# Patient Record
Sex: Male | Born: 1960 | Race: White | Hispanic: No | Marital: Single | Smoking: Current every day smoker
Health system: Southern US, Community
[De-identification: ages and names within clinical notes are randomized; demographics above are authoritative.]

## PROBLEM LIST (undated history)

## (undated) DIAGNOSIS — J189 Pneumonia, unspecified organism: Secondary | ICD-10-CM

## (undated) DIAGNOSIS — E119 Type 2 diabetes mellitus without complications: Secondary | ICD-10-CM

## (undated) HISTORY — DX: Type 2 diabetes mellitus without complications: E11.9

## (undated) HISTORY — PX: BACK SURGERY: SHX140

---

## 2020-09-20 ENCOUNTER — Other Ambulatory Visit: Payer: Self-pay

## 2020-09-20 DIAGNOSIS — Z20822 Contact with and (suspected) exposure to covid-19: Secondary | ICD-10-CM

## 2020-09-21 LAB — SARS-COV-2, NAA 2 DAY TAT

## 2020-09-21 LAB — NOVEL CORONAVIRUS, NAA: SARS-CoV-2, NAA: NOT DETECTED

## 2021-09-11 ENCOUNTER — Emergency Department (HOSPITAL_COMMUNITY)
Admission: EM | Admit: 2021-09-11 | Discharge: 2021-09-11 | Disposition: A | Discharge status: Home / Self Care | Payer: 59 | Source: Home / Self Care | Attending: Emergency Medicine | Admitting: Emergency Medicine

## 2021-09-11 ENCOUNTER — Emergency Department (HOSPITAL_COMMUNITY): Payer: 59

## 2021-09-11 ENCOUNTER — Other Ambulatory Visit: Payer: Self-pay

## 2021-09-11 ENCOUNTER — Encounter (HOSPITAL_COMMUNITY): Payer: Self-pay | Admitting: Emergency Medicine

## 2021-09-11 DIAGNOSIS — E1165 Type 2 diabetes mellitus with hyperglycemia: Secondary | ICD-10-CM | POA: Insufficient documentation

## 2021-09-11 DIAGNOSIS — A419 Sepsis, unspecified organism: Secondary | ICD-10-CM | POA: Diagnosis not present

## 2021-09-11 DIAGNOSIS — J189 Pneumonia, unspecified organism: Secondary | ICD-10-CM

## 2021-09-11 DIAGNOSIS — R112 Nausea with vomiting, unspecified: Secondary | ICD-10-CM | POA: Insufficient documentation

## 2021-09-11 DIAGNOSIS — R197 Diarrhea, unspecified: Secondary | ICD-10-CM | POA: Insufficient documentation

## 2021-09-11 DIAGNOSIS — R000 Tachycardia, unspecified: Secondary | ICD-10-CM | POA: Insufficient documentation

## 2021-09-11 DIAGNOSIS — R918 Other nonspecific abnormal finding of lung field: Secondary | ICD-10-CM

## 2021-09-11 DIAGNOSIS — Z20822 Contact with and (suspected) exposure to covid-19: Secondary | ICD-10-CM | POA: Insufficient documentation

## 2021-09-11 DIAGNOSIS — R739 Hyperglycemia, unspecified: Secondary | ICD-10-CM

## 2021-09-11 DIAGNOSIS — D696 Thrombocytopenia, unspecified: Secondary | ICD-10-CM | POA: Insufficient documentation

## 2021-09-11 HISTORY — DX: Type 2 diabetes mellitus without complications: E11.9

## 2021-09-11 LAB — COMPREHENSIVE METABOLIC PANEL
ALT: 37 U/L (ref 0–44)
AST: 38 U/L (ref 15–41)
Albumin: 2.5 g/dL — ABNORMAL LOW (ref 3.5–5.0)
Alkaline Phosphatase: 83 U/L (ref 38–126)
Anion gap: 14 (ref 5–15)
BUN: 35 mg/dL — ABNORMAL HIGH (ref 8–23)
CO2: 20 mmol/L — ABNORMAL LOW (ref 22–32)
Calcium: 7.8 mg/dL — ABNORMAL LOW (ref 8.9–10.3)
Chloride: 90 mmol/L — ABNORMAL LOW (ref 98–111)
Creatinine, Ser: 1.15 mg/dL (ref 0.61–1.24)
GFR, Estimated: >60 mL/min (ref >60)
Glucose, Bld: 590 mg/dL (ref 70–99)
Potassium: 4.2 mmol/L (ref 3.5–5.1)
Sodium: 124 mmol/L — ABNORMAL LOW (ref 135–145)
Total Bilirubin: 1 mg/dL (ref 0.3–1.2)
Total Protein: 6.5 g/dL (ref 6.5–8.1)

## 2021-09-11 LAB — CBC WITH DIFFERENTIAL/PLATELET
Abs Immature Granulocytes: 0.09 10*3/uL — ABNORMAL HIGH (ref 0.00–0.07)
Basophils Absolute: 0 10*3/uL (ref 0.0–0.1)
Basophils Relative: 0 %
Eosinophils Absolute: 0 10*3/uL (ref 0.0–0.5)
Eosinophils Relative: 0 %
HCT: 39.5 % (ref 39.0–52.0)
Hemoglobin: 13.9 g/dL (ref 13.0–17.0)
Immature Granulocytes: 1 %
Lymphocytes Relative: 5 %
Lymphs Abs: 0.4 10*3/uL — ABNORMAL LOW (ref 0.7–4.0)
MCH: 35.2 pg — ABNORMAL HIGH (ref 26.0–34.0)
MCHC: 35.2 g/dL (ref 30.0–36.0)
MCV: 100 fL (ref 80.0–100.0)
Monocytes Absolute: 0.7 10*3/uL (ref 0.1–1.0)
Monocytes Relative: 8 %
Neutro Abs: 6.8 10*3/uL (ref 1.7–7.7)
Neutrophils Relative %: 86 %
Platelets: 137 10*3/uL — ABNORMAL LOW (ref 150–400)
RBC: 3.95 MIL/uL — ABNORMAL LOW (ref 4.22–5.81)
RDW: 11.9 % (ref 11.5–15.5)
WBC: 8 10*3/uL (ref 4.0–10.5)
nRBC: 0 % (ref 0.0–0.2)

## 2021-09-11 LAB — BLOOD GAS, VENOUS
Acid-base deficit: 4.9 mmol/L — ABNORMAL HIGH (ref 0.0–2.0)
Bicarbonate: 20.8 mmol/L (ref 20.0–28.0)
FIO2: 21
O2 Saturation: 97.6 %
Patient temperature: 36.7
pCO2, Ven: 31.2 mmHg — ABNORMAL LOW (ref 44.0–60.0)
pH, Ven: 7.401 (ref 7.250–7.430)
pO2, Ven: 116 mmHg — ABNORMAL HIGH (ref 32.0–45.0)

## 2021-09-11 LAB — BASIC METABOLIC PANEL
Anion gap: 14 (ref 5–15)
BUN: 33 mg/dL — ABNORMAL HIGH (ref 8–23)
CO2: 19 mmol/L — ABNORMAL LOW (ref 22–32)
Calcium: 7.7 mg/dL — ABNORMAL LOW (ref 8.9–10.3)
Chloride: 96 mmol/L — ABNORMAL LOW (ref 98–111)
Creatinine, Ser: 1.08 mg/dL (ref 0.61–1.24)
GFR, Estimated: >60 mL/min (ref >60)
Glucose, Bld: 474 mg/dL — ABNORMAL HIGH (ref 70–99)
Potassium: 4.2 mmol/L (ref 3.5–5.1)
Sodium: 129 mmol/L — ABNORMAL LOW (ref 135–145)

## 2021-09-11 LAB — RESP PANEL BY RT-PCR (FLU A&B, COVID) ARPGX2
Influenza A by PCR: NEGATIVE
Influenza B by PCR: NEGATIVE
SARS Coronavirus 2 by RT PCR: NEGATIVE

## 2021-09-11 LAB — APTT: aPTT: 31 seconds (ref 24–36)

## 2021-09-11 LAB — CBG MONITORING, ED
Glucose-Capillary: 520 mg/dL (ref 70–99)
Glucose-Capillary: 453 mg/dL — ABNORMAL HIGH (ref 70–99)

## 2021-09-11 LAB — PROTIME-INR
INR: 1.1 (ref 0.8–1.2)
Prothrombin Time: 14.6 seconds (ref 11.4–15.2)

## 2021-09-11 LAB — MAGNESIUM: Magnesium: 2.3 mg/dL (ref 1.7–2.4)

## 2021-09-11 LAB — LACTIC ACID, PLASMA
Lactic Acid, Venous: 1.7 mmol/L (ref 0.5–1.9)
Lactic Acid, Venous: 1.7 mmol/L (ref 0.5–1.9)

## 2021-09-11 MED ORDER — DEXTROSE 50 % IV SOLN: 0.0000 mL | INTRAVENOUS | Status: DC | PRN

## 2021-09-11 MED ORDER — DEXTROSE IN LACTATED RINGERS 5 % IV SOLN
INTRAVENOUS | Status: DC
Start: 2021-09-11 — End: 2021-09-11

## 2021-09-11 MED ORDER — SODIUM CHLORIDE 0.9 % IV BOLUS (SEPSIS)
1500.0000 mL | Freq: Once | INTRAVENOUS | Status: AC
Start: 2021-09-11 — End: 2021-09-11
  Administered 2021-09-11: 1500 mL via INTRAVENOUS

## 2021-09-11 MED ORDER — AZITHROMYCIN 250 MG PO TABS
500.0000 mg | ORAL_TABLET | Freq: Once | ORAL | Status: AC
  Administered 2021-09-11: 500 mg via ORAL
  Filled 2021-09-11: qty 2

## 2021-09-11 MED ORDER — ONDANSETRON HCL 4 MG PO TABS: 4.0000 mg | ORAL_TABLET | Freq: Three times a day (TID) | ORAL | 0 refills | Status: DC | PRN

## 2021-09-11 MED ORDER — AZITHROMYCIN 250 MG PO TABS: 250.0000 mg | ORAL_TABLET | Freq: Every day | ORAL | 0 refills | Status: DC

## 2021-09-11 MED ORDER — INSULIN REGULAR(HUMAN) IN NACL 100-0.9 UT/100ML-% IV SOLN
INTRAVENOUS | Status: DC
Start: 2021-09-11 — End: 2021-09-11

## 2021-09-11 MED ORDER — SODIUM CHLORIDE 0.9 % IV BOLUS (SEPSIS)
2500.0000 mL | Freq: Once | INTRAVENOUS | Status: DC
Start: 2021-09-11 — End: 2021-09-11

## 2021-09-11 MED ORDER — SODIUM CHLORIDE 0.9 % IV SOLN
1.0000 g | Freq: Once | INTRAVENOUS | Status: DC
  Filled 2021-09-11: qty 10

## 2021-09-11 MED ORDER — LACTATED RINGERS IV SOLN: INTRAVENOUS | Status: DC

## 2021-09-11 NOTE — ED Triage Notes (Signed)
Pt to the ED [facility] who called a code sepsis in route.  Pt went to [facility] last Wednesday with Covid symptoms but was not tested.  Pt was given one gram of Rocephin in the left deltoid in route as well as a 500 bolus.

## 2021-09-11 NOTE — ED Provider Notes (Signed)
[facility] EMERGENCY DEPARTMENT Provider Note   CSN: [id number] Arrival date & time: 09/11/21  1613     History Chief Complaint  Patient presents with   Code Sepsis    Howard Cameron is a 60 y.o. male who presents the emergency department via EMS for weakness.  He has a past medical history of daily smoking and diabetes.  Patient does not take any medications for his diabetes.  He began having symptoms of nausea vomiting and diarrhea starting on Tuesday of last week.  He has had a rapidly progressive decline in states that his daughter and wife made him come by EMS today.  He has been too weak to smoke and too weak to walk to the bathroom today.  He states that he feels pretty terrible.  He denies any abdominal pain.  He denies fevers or chills.  He was seen at an urgent care earlier this week due to COVID symptoms but apparently did not get COVID tested.  He denies any other known medical issues.  EMS called a code sepsis he was given a liter of fluid and a gram of Rocephin prior to arrival  HPI     Past Medical History:  Diagnosis Date   Diabetes mellitus without complication (HCC)     There are no problems to display for this patient.       No family history on file.     Home Medications Prior to Admission medications   Not on File    Allergies    Patient has no known allergies.  Review of Systems   Review of Systems Ten systems reviewed and are negative for acute change, except as noted in the HPI.   Physical Exam Updated Vital Signs BP 96/63   Pulse (!) 105   Temp 98.1 F (36.7 C) (Oral)   Resp (!) 22   Ht 5\' 10"  (1.778 m)   Wt 79.4 kg   SpO2 93%   BMI 25.11 kg/m   Physical Exam Vitals and nursing note reviewed.  Constitutional:      General: He is not in acute distress.    Appearance: He is well-developed. He is ill-appearing. He is not toxic-appearing or diaphoretic.  HENT:     Head: Normocephalic and atraumatic.  Eyes:     General: No  scleral icterus.    Conjunctiva/sclera: Conjunctivae normal.  Cardiovascular:     Rate and Rhythm: Regular rhythm. Tachycardia present.     Pulses:          Dorsalis pedis pulses are 2+ on the right side and 2+ on the left side.       Posterior tibial pulses are 2+ on the right side and 2+ on the left side.     Heart sounds: Normal heart sounds.  Pulmonary:     Effort: Pulmonary effort is normal. Tachypnea present. No respiratory distress.     Breath sounds: Normal breath sounds.  Abdominal:     Palpations: Abdomen is soft.     Tenderness: There is no abdominal tenderness.  Musculoskeletal:     Cervical back: Normal range of motion and neck supple.  Skin:    General: Skin is warm and dry.  Neurological:     Mental Status: He is alert.  Psychiatric:        Behavior: Behavior normal.    ED Results / Procedures / Treatments   Labs (all labs ordered are listed, but only abnormal results are displayed) Labs Reviewed  RESP  PANEL BY RT-PCR (FLU A&B, COVID) [id number]  CULTURE, BLOOD (ROUTINE X 2)  CULTURE, BLOOD (ROUTINE X 2)  LACTIC ACID, PLASMA  LACTIC ACID, PLASMA  COMPREHENSIVE METABOLIC PANEL  CBC WITH DIFFERENTIAL/PLATELET  PROTIME-INR  APTT  URINALYSIS, ROUTINE W REFLEX MICROSCOPIC  BLOOD GAS, VENOUS    EKG None  Radiology No results found.  Procedures Procedures   Medications Ordered in ED Medications  sodium chloride 0.9 % bolus 2,500 mL (has no administration in time range)    ED Course  I have reviewed the triage vital signs and the nursing notes.  Pertinent labs & imaging results that were available during my care of the patient were reviewed by me and considered in my medical decision making (see chart for details).    MDM Rules/Calculators/A&P                         60 year old male here with complaint of generalized weakness.The differential diagnosis of weakness includes but is not limited to neurologic causes (GBS, myasthenia gravis, CVA, MS, ALS,  transverse myelitis, spinal cord injury, CVA, botulism, ) and other causes: ACS, Arrhythmia, syncope, orthostatic hypotension, sepsis, hypoglycemia, electrolyte disturbance, hypothyroidism, respiratory failure, symptomatic anemia, dehydration, heat injury, polypharmacy, malignancy, DKA DKADKA. The patient is a noncompliant diabetic.  Code sepsis was initiated by EMS.  Upon arrival patient was tachypneic, hypotensive and tachycardic.  Sepsis work-up initiated. I ordered and reviewed labs that included CBC without elevated white blood cell count, mild thrombocytopenia.  CMP with blood glucose of 590, hypocalcemia of 7.8 which corrected for hypoalbuminemia is 8.2, VBG shows slightly low bicarb level suggestive of acidosis.  Lactate level within normal limits, mag level added for hypercalcemia within normal limits.  COVID panel is negative, PT/INR and APTT within normal limits.  I ordered and reviewed a 1 view chest x-ray which shows right upper and middle lobe consolidations consistent with Multi lobar pneumonia.  Upon reviewing all the data points I returned to speak with the patient about admission.  Patient is adamant that he does not want to be admitted.  I had a long discussion with the patient about risks that include severe morbidity or mortality given his pneumonia and hyperglycemia which likely needs to be managed with insulin.  Patient understands his risks but declines admission despite extended conversation about all of the patient's current illnesses and need to be hospitalized. Patient is discharged in stable but serious condition.  I ordered and reviewed Date: 09/11/2021 Patient: Howard Cameron Admitted: 09/11/2021  4:16 PM Attending Provider: Linwood Dibbles, MD  Howard Cameron or his authorized caregiver has made the decision for the patient to leave the emergency department against the advice of [facility]   He or his authorized caregiver has been informed and understands the  inherent risks, including death.  He or his authorized caregiver has decided to accept the responsibility for this decision. Howard Cameron and all necessary parties have been advised that he may return for further evaluation or treatment. His condition at time of discharge was Serious.  Howard Cameron had current vital signs as follows:  Blood pressure 134/83, pulse 98, temperature 98.1 F (36.7 C), temperature source Oral, resp. rate 20, height 5\' 10"  (1.778 m), weight 79.4 kg, SpO2 96 %.   or his authorized caregiver has signed the Leaving Against Medical Advice form prior to leaving the department.  Howard Cameron 09/11/2021    Final Clinical Impression(s) /  ED Diagnoses Final diagnoses:  Multilobar lung infiltrate  Community acquired pneumonia of right lung, unspecified part of lung  Hyperglycemia    Rx / DC Orders ED Discharge Orders     None        Arthor Captain, PA-C 09/11/21 1956    Linwood Dibbles, MD 09/13/21 [phone]

## 2021-09-11 NOTE — Discharge Instructions (Addendum)
You have multilobar right-sided pneumonia.  You are leaving AGAINST MEDICAL ADVICE but will receive antibiotics.  Please understand that you are welcome to return to the emergency department at any point with the recommendation for admission.  Currently your blood sugar is excessively high and would need insulin management.  I am unable to provide any management for your blood sugar in the outpatient setting and you must follow-up with a primary care physician as soon as possible.  Understand that you are leaving the hospital in serious condition with the possibility of severe morbid outcomes and or death. Uncontrolled high blood sugar has many effects including accelerated plaquing of the arteries which could lead to stroke, heart attack, poor circulation to the legs and arms, loss of limbs, blindness, retinal detachment, kidney failure, chronic pain due to neuropathy and overall very poor quality of life.  Get help right away if: Your shortness of breath becomes worse. Your chest pain increases. Your sickness becomes worse, especially if you are an older adult or have a weak immune system. You cough up blood.

## 2021-09-13 ENCOUNTER — Encounter (HOSPITAL_COMMUNITY): Payer: Self-pay

## 2021-09-13 ENCOUNTER — Inpatient Hospital Stay (HOSPITAL_COMMUNITY)
Admission: EM | Admit: 2021-09-13 | Discharge: 2021-09-16 | DRG: 871 | Disposition: A | Discharge status: Home / Self Care | Payer: 59 | Attending: Family Medicine | Admitting: Family Medicine

## 2021-09-13 ENCOUNTER — Other Ambulatory Visit: Payer: Self-pay

## 2021-09-13 ENCOUNTER — Emergency Department (HOSPITAL_COMMUNITY): Payer: 59

## 2021-09-13 DIAGNOSIS — Z20822 Contact with and (suspected) exposure to covid-19: Secondary | ICD-10-CM | POA: Diagnosis present

## 2021-09-13 DIAGNOSIS — E1149 Type 2 diabetes mellitus with other diabetic neurological complication: Secondary | ICD-10-CM | POA: Diagnosis present

## 2021-09-13 DIAGNOSIS — J189 Pneumonia, unspecified organism: Secondary | ICD-10-CM | POA: Diagnosis present

## 2021-09-13 DIAGNOSIS — Z23 Encounter for immunization: Secondary | ICD-10-CM

## 2021-09-13 DIAGNOSIS — Z6825 Body mass index (BMI) 25.0-25.9, adult: Secondary | ICD-10-CM | POA: Diagnosis not present

## 2021-09-13 DIAGNOSIS — K7200 Acute and subacute hepatic failure without coma: Secondary | ICD-10-CM | POA: Diagnosis not present

## 2021-09-13 DIAGNOSIS — E440 Moderate protein-calorie malnutrition: Secondary | ICD-10-CM | POA: Diagnosis present

## 2021-09-13 DIAGNOSIS — E1165 Type 2 diabetes mellitus with hyperglycemia: Secondary | ICD-10-CM | POA: Diagnosis present

## 2021-09-13 DIAGNOSIS — E559 Vitamin D deficiency, unspecified: Secondary | ICD-10-CM | POA: Diagnosis present

## 2021-09-13 DIAGNOSIS — Z79899 Other long term (current) drug therapy: Secondary | ICD-10-CM | POA: Diagnosis not present

## 2021-09-13 DIAGNOSIS — A419 Sepsis, unspecified organism: Principal | ICD-10-CM | POA: Diagnosis present

## 2021-09-13 DIAGNOSIS — E119 Type 2 diabetes mellitus without complications: Secondary | ICD-10-CM | POA: Diagnosis not present

## 2021-09-13 DIAGNOSIS — F17210 Nicotine dependence, cigarettes, uncomplicated: Secondary | ICD-10-CM | POA: Diagnosis present

## 2021-09-13 DIAGNOSIS — R197 Diarrhea, unspecified: Secondary | ICD-10-CM | POA: Diagnosis present

## 2021-09-13 DIAGNOSIS — E46 Unspecified protein-calorie malnutrition: Secondary | ICD-10-CM | POA: Diagnosis present

## 2021-09-13 DIAGNOSIS — R6520 Severe sepsis without septic shock: Secondary | ICD-10-CM | POA: Diagnosis not present

## 2021-09-13 HISTORY — DX: Pneumonia, unspecified organism: J18.9

## 2021-09-13 LAB — CBC WITH DIFFERENTIAL/PLATELET
Abs Immature Granulocytes: 0.12 10*3/uL — ABNORMAL HIGH (ref 0.00–0.07)
Basophils Absolute: 0.1 10*3/uL (ref 0.0–0.1)
Basophils Relative: 1 %
Eosinophils Absolute: 0.1 10*3/uL (ref 0.0–0.5)
Eosinophils Relative: 2 %
HCT: 41.8 % (ref 39.0–52.0)
Hemoglobin: 14 g/dL (ref 13.0–17.0)
Immature Granulocytes: 2 %
Lymphocytes Relative: 11 %
Lymphs Abs: 0.9 10*3/uL (ref 0.7–4.0)
MCH: 34.6 pg — ABNORMAL HIGH (ref 26.0–34.0)
MCHC: 33.5 g/dL (ref 30.0–36.0)
MCV: 103.2 fL — ABNORMAL HIGH (ref 80.0–100.0)
Monocytes Absolute: 0.7 10*3/uL (ref 0.1–1.0)
Monocytes Relative: 8 %
Neutro Abs: 6.4 10*3/uL (ref 1.7–7.7)
Neutrophils Relative %: 76 %
Platelets: 205 10*3/uL (ref 150–400)
RBC: 4.05 MIL/uL — ABNORMAL LOW (ref 4.22–5.81)
RDW: 12.2 % (ref 11.5–15.5)
WBC: 8.3 10*3/uL (ref 4.0–10.5)
nRBC: 0 % (ref 0.0–0.2)

## 2021-09-13 LAB — COMPREHENSIVE METABOLIC PANEL
ALT: 72 U/L — ABNORMAL HIGH (ref 0–44)
AST: 112 U/L — ABNORMAL HIGH (ref 15–41)
Albumin: 2.7 g/dL — ABNORMAL LOW (ref 3.5–5.0)
Alkaline Phosphatase: 93 U/L (ref 38–126)
Anion gap: 6 (ref 5–15)
BUN: 24 mg/dL — ABNORMAL HIGH (ref 8–23)
CO2: 28 mmol/L (ref 22–32)
Calcium: 7.9 mg/dL — ABNORMAL LOW (ref 8.9–10.3)
Chloride: 92 mmol/L — ABNORMAL LOW (ref 98–111)
Creatinine, Ser: 0.93 mg/dL (ref 0.61–1.24)
GFR, Estimated: >60 mL/min (ref >60)
Glucose, Bld: 305 mg/dL — ABNORMAL HIGH (ref 70–99)
Potassium: 3.7 mmol/L (ref 3.5–5.1)
Sodium: 126 mmol/L — ABNORMAL LOW (ref 135–145)
Total Bilirubin: 1 mg/dL (ref 0.3–1.2)
Total Protein: 7 g/dL (ref 6.5–8.1)

## 2021-09-13 LAB — URINALYSIS, ROUTINE W REFLEX MICROSCOPIC
Bilirubin Urine: NEGATIVE
Glucose, UA: >=500 mg/dL — AB
Ketones, ur: 20 mg/dL — AB
Leukocytes,Ua: NEGATIVE
Nitrite: NEGATIVE
Protein, ur: 30 mg/dL — AB
Specific Gravity, Urine: 1.023 (ref 1.005–1.030)
pH: 6 (ref 5.0–8.0)

## 2021-09-13 LAB — CBG MONITORING, ED: Glucose-Capillary: 319 mg/dL — ABNORMAL HIGH (ref 70–99)

## 2021-09-13 LAB — APTT: aPTT: 29 seconds (ref 24–36)

## 2021-09-13 LAB — C DIFFICILE QUICK SCREEN W PCR REFLEX
C Diff antigen: POSITIVE — AB
C Diff toxin: NEGATIVE

## 2021-09-13 LAB — LACTIC ACID, PLASMA
Lactic Acid, Venous: 2.2 mmol/L (ref 0.5–1.9)
Lactic Acid, Venous: 1.8 mmol/L (ref 0.5–1.9)

## 2021-09-13 LAB — PROTIME-INR
INR: 1 (ref 0.8–1.2)
Prothrombin Time: 13.5 seconds (ref 11.4–15.2)

## 2021-09-13 LAB — BETA-HYDROXYBUTYRIC ACID: Beta-Hydroxybutyric Acid: 1.94 mmol/L — ABNORMAL HIGH (ref 0.05–0.27)

## 2021-09-13 MED ORDER — SODIUM CHLORIDE 0.9 % IV SOLN
2.0000 g | INTRAVENOUS | Status: DC
  Administered 2021-09-13: 2 g via INTRAVENOUS
  Filled 2021-09-13: qty 20

## 2021-09-13 MED ORDER — LACTATED RINGERS IV SOLN: INTRAVENOUS | Status: DC

## 2021-09-13 MED ORDER — SODIUM CHLORIDE 0.9 % IV SOLN
500.0000 mg | INTRAVENOUS | Status: DC
  Administered 2021-09-13: 500 mg via INTRAVENOUS
  Filled 2021-09-13: qty 500

## 2021-09-13 MED ORDER — DIPHENHYDRAMINE HCL 50 MG/ML IJ SOLN
25.0000 mg | Freq: Once | INTRAMUSCULAR | Status: AC
  Administered 2021-09-14: 25 mg via INTRAVENOUS
  Filled 2021-09-13 (×2): qty 1

## 2021-09-13 MED ORDER — LACTATED RINGERS IV BOLUS
1000.0000 mL | Freq: Once | INTRAVENOUS | Status: AC
  Administered 2021-09-13: 1000 mL via INTRAVENOUS

## 2021-09-13 MED ORDER — INSULIN ASPART 100 UNIT/ML IJ SOLN
0.0000 [IU] | Freq: Three times a day (TID) | INTRAMUSCULAR | Status: DC
  Administered 2021-09-14 (×2): 5 [IU] via SUBCUTANEOUS
  Administered 2021-09-14 – 2021-09-15 (×2): 8 [IU] via SUBCUTANEOUS
  Administered 2021-09-15 (×2): 3 [IU] via SUBCUTANEOUS
  Administered 2021-09-16: 5 [IU] via SUBCUTANEOUS

## 2021-09-13 MED ORDER — INSULIN ASPART 100 UNIT/ML IJ SOLN
0.0000 [IU] | Freq: Every day | INTRAMUSCULAR | Status: DC
  Administered 2021-09-14: 2 [IU] via SUBCUTANEOUS

## 2021-09-13 MED ORDER — MELATONIN 3 MG PO TABS
6.0000 mg | ORAL_TABLET | Freq: Every day | ORAL | Status: DC
  Administered 2021-09-14 – 2021-09-15 (×3): 6 mg via ORAL
  Filled 2021-09-13 (×4): qty 2

## 2021-09-13 NOTE — ED Provider Notes (Signed)
[facility] EMERGENCY DEPARTMENT Provider Note   CSN: [id number] Arrival date & time: 09/13/21  1946     History Chief Complaint  Patient presents with   Pneumonia    Howard Cameron is a 60 y.o. male with PMH diabetes who presents to the emergency department for evaluation of cough, fever, diarrhea, chills and hyperglycemia.  Patient was previously seen on 9/27 here at [facility] and diagnosed with significant hyperglycemia in the setting of a multilobar right-sided pneumonia and the patient ultimately left AMA due to having to take care of his children at home.  Patient returns now stating that he feels more of a "mental fog" and all of his symptoms have been persistent.  Also endorses copious watery nonbloody diarrhea.  Denies abdominal pain, headache, persistent vomiting or other systemic symptoms.   Pneumonia Pertinent negatives include no chest pain, no abdominal pain and no shortness of breath.      Past Medical History:  Diagnosis Date   Diabetes mellitus without complication (HCC)    Pneumonia     There are no problems to display for this patient.   History reviewed. No pertinent surgical history.     No family history on file.  Social History   Tobacco Use   Smoking status: Every Day    Packs/day: 0.50    Types: Cigarettes   Smokeless tobacco: Never  Substance Use Topics   Alcohol use: Yes    Comment: couple of drinks a night   Drug use: Not Currently    Home Medications Prior to Admission medications   Medication Sig Start Date End Date Taking? Authorizing Provider  azithromycin (ZITHROMAX Z-PAK) 250 MG tablet Take 1 tablet (250 mg total) by mouth daily for 4 days. 2 po day one, then 1 daily x 4 days 09/11/21 09/15/21  Arthor Captain, PA-C  ondansetron (ZOFRAN) 4 MG tablet Take 1 tablet (4 mg total) by mouth every 8 (eight) hours as needed for nausea or vomiting. 09/11/21   Arthor Captain, PA-C    Allergies    Patient has no known  allergies.  Review of Systems   Review of Systems  Constitutional:  Positive for chills and fever.  HENT:  Negative for ear pain and sore throat.   Eyes:  Negative for pain and visual disturbance.  Respiratory:  Positive for cough. Negative for shortness of breath.   Cardiovascular:  Negative for chest pain and palpitations.  Gastrointestinal:  Positive for diarrhea. Negative for abdominal pain and vomiting.  Genitourinary:  Negative for dysuria and hematuria.  Musculoskeletal:  Negative for arthralgias and back pain.  Skin:  Negative for color change and rash.  Neurological:  Negative for seizures and syncope.  All other systems reviewed and are negative.  Physical Exam Updated Vital Signs BP (!) 144/93   Pulse (!) 113   Temp 98.5 F (36.9 C)   Resp (!) 26   Ht 5\' 10"  (1.778 m)   Wt 79.4 kg   SpO2 93%   BMI 25.11 kg/m   Physical Exam Vitals and nursing note reviewed.  Constitutional:      Appearance: He is well-developed. He is ill-appearing.  HENT:     Head: Normocephalic and atraumatic.  Eyes:     Conjunctiva/sclera: Conjunctivae normal.  Cardiovascular:     Rate and Rhythm: Regular rhythm. Tachycardia present.     Heart sounds: No murmur heard. Pulmonary:     Effort: Pulmonary effort is normal. No respiratory distress.     Breath  sounds: Rhonchi (RLL) present.  Abdominal:     Palpations: Abdomen is soft.     Tenderness: There is no abdominal tenderness.  Musculoskeletal:     Cervical back: Neck supple.  Skin:    General: Skin is warm and dry.  Neurological:     Mental Status: He is alert.    ED Results / Procedures / Treatments   Labs (all labs ordered are listed, but only abnormal results are displayed) Labs Reviewed  LACTIC ACID, PLASMA - Abnormal; Notable for the following components:      Result Value   Lactic Acid, Venous 2.2 (*)    All other components within normal limits  COMPREHENSIVE METABOLIC PANEL - Abnormal; Notable for the following  components:   Sodium 126 (*)    Chloride 92 (*)    Glucose, Bld 305 (*)    BUN 24 (*)    Calcium 7.9 (*)    Albumin 2.7 (*)    AST 112 (*)    ALT 72 (*)    All other components within normal limits  CBC WITH DIFFERENTIAL/PLATELET - Abnormal; Notable for the following components:   RBC 4.05 (*)    MCV 103.2 (*)    MCH 34.6 (*)    Abs Immature Granulocytes 0.12 (*)    All other components within normal limits  BETA-HYDROXYBUTYRIC ACID - Abnormal; Notable for the following components:   Beta-Hydroxybutyric Acid 1.94 (*)    All other components within normal limits  CBG MONITORING, ED - Abnormal; Notable for the following components:   Glucose-Capillary 319 (*)    All other components within normal limits  CULTURE, BLOOD (ROUTINE X 2)  CULTURE, BLOOD (ROUTINE X 2)  URINE CULTURE  C DIFFICILE QUICK SCREEN W PCR REFLEX    PROTIME-INR  APTT  LACTIC ACID, PLASMA  URINALYSIS, ROUTINE W REFLEX MICROSCOPIC    EKG None  Radiology DG Chest Port 1 View  Result Date: 09/13/2021 CLINICAL DATA:  Sepsis EXAM: PORTABLE CHEST 1 VIEW COMPARISON:  09/11/2021 FINDINGS: Mild right-sided volume loss has developed. Extensive right perihilar pulmonary infiltrate is again seen, likely infectious in the acute setting. No pneumothorax or pleural effusion. Left lung is clear. Cardiac size within normal limits. Pulmonary vascularity is normal. No acute bone abnormality. IMPRESSION: Stable extensive right perihilar pulmonary infiltrate, likely infectious in the acute setting. Developing mild right-sided volume loss. Electronically Signed   By: Helyn Numbers M.D.   On: 09/13/2021 21:43    Procedures Procedures   Medications Ordered in ED Medications  lactated ringers infusion ( Intravenous New Bag/Given 09/13/21 2119)  cefTRIAXone (ROCEPHIN) 2 g in sodium chloride 0.9 % 100 mL IVPB (2 g Intravenous New Bag/Given 09/13/21 2113)  azithromycin (ZITHROMAX) 500 mg in sodium chloride 0.9 % 250 mL IVPB (500 mg  Intravenous New Bag/Given 09/13/21 2125)  lactated ringers bolus 1,000 mL (1,000 mLs Intravenous New Bag/Given 09/13/21 2119)    ED Course  I have reviewed the triage vital signs and the nursing notes.  Pertinent labs & imaging results that were available during my care of the patient were reviewed by me and considered in my medical decision making (see chart for details).    MDM Rules/Calculators/A&P                           Seen emergency department for evaluation of multiple complaints as described above.  Physical exam reveals decreased breath sounds and rhonchi in the right lower lobe  but is otherwise unremarkable.  Laboratory evaluation reveals hyponatremia to 126 likely with an element of pseudohyponatremia with a glucose of 305.  CO2 is 28 but beta hydroxybutyrate elevated to 1.94, no anion gap.  CBC unremarkable.  Chest x-ray with persistent right middle and lower lobar pneumonia and patient started on ceftriaxone azithromycin.  Lactate elevated 2.2 and patient given 1 L lactated Ringer's.  Patient remains persistently tachycardic and saturating 93% on room air.  Patient will require admission for pneumonia and hyperglycemia.  Patient admitted. Final Clinical Impression(s) / ED Diagnoses Final diagnoses:  None    Rx / DC Orders ED Discharge Orders     None        Odies Desa, Wyn Forster, MD 09/13/21 2228

## 2021-09-13 NOTE — ED Triage Notes (Signed)
Pt was seen her on 9/27, was brought in by EMS as a code sepsis, pt was diagnosed with PNA, put on antibiotics, pt is also diabetic and has never taking meds for this. Pt was supposed to be admitted on the 27th, but left AMA. Pt is back because he "doesn't feel well."

## 2021-09-14 DIAGNOSIS — E46 Unspecified protein-calorie malnutrition: Secondary | ICD-10-CM | POA: Diagnosis present

## 2021-09-14 DIAGNOSIS — A419 Sepsis, unspecified organism: Principal | ICD-10-CM

## 2021-09-14 DIAGNOSIS — E119 Type 2 diabetes mellitus without complications: Secondary | ICD-10-CM

## 2021-09-14 DIAGNOSIS — K7200 Acute and subacute hepatic failure without coma: Secondary | ICD-10-CM

## 2021-09-14 DIAGNOSIS — E1165 Type 2 diabetes mellitus with hyperglycemia: Secondary | ICD-10-CM

## 2021-09-14 DIAGNOSIS — R6520 Severe sepsis without septic shock: Secondary | ICD-10-CM

## 2021-09-14 DIAGNOSIS — J189 Pneumonia, unspecified organism: Secondary | ICD-10-CM

## 2021-09-14 LAB — COMPREHENSIVE METABOLIC PANEL
ALT: 51 U/L — ABNORMAL HIGH (ref 0–44)
AST: 61 U/L — ABNORMAL HIGH (ref 15–41)
Albumin: 2.1 g/dL — ABNORMAL LOW (ref 3.5–5.0)
Alkaline Phosphatase: 74 U/L (ref 38–126)
Anion gap: 10 (ref 5–15)
BUN: 21 mg/dL (ref 8–23)
CO2: 26 mmol/L (ref 22–32)
Calcium: 8 mg/dL — ABNORMAL LOW (ref 8.9–10.3)
Chloride: 96 mmol/L — ABNORMAL LOW (ref 98–111)
Creatinine, Ser: 0.71 mg/dL (ref 0.61–1.24)
GFR, Estimated: >60 mL/min (ref >60)
Glucose, Bld: 232 mg/dL — ABNORMAL HIGH (ref 70–99)
Potassium: 4.1 mmol/L (ref 3.5–5.1)
Sodium: 132 mmol/L — ABNORMAL LOW (ref 135–145)
Total Bilirubin: 0.8 mg/dL (ref 0.3–1.2)
Total Protein: 5.5 g/dL — ABNORMAL LOW (ref 6.5–8.1)

## 2021-09-14 LAB — PROTIME-INR
INR: 1.2 (ref 0.8–1.2)
Prothrombin Time: 14.8 seconds (ref 11.4–15.2)

## 2021-09-14 LAB — CBC
HCT: 35.8 % — ABNORMAL LOW (ref 39.0–52.0)
Hemoglobin: 12.3 g/dL — ABNORMAL LOW (ref 13.0–17.0)
MCH: 35.1 pg — ABNORMAL HIGH (ref 26.0–34.0)
MCHC: 34.4 g/dL (ref 30.0–36.0)
MCV: 102.3 fL — ABNORMAL HIGH (ref 80.0–100.0)
Platelets: 209 10*3/uL (ref 150–400)
RBC: 3.5 MIL/uL — ABNORMAL LOW (ref 4.22–5.81)
RDW: 12.1 % (ref 11.5–15.5)
WBC: 7.1 10*3/uL (ref 4.0–10.5)
nRBC: 0 % (ref 0.0–0.2)

## 2021-09-14 LAB — GLUCOSE, CAPILLARY
Glucose-Capillary: 236 mg/dL — ABNORMAL HIGH (ref 70–99)
Glucose-Capillary: 206 mg/dL — ABNORMAL HIGH (ref 70–99)
Glucose-Capillary: 237 mg/dL — ABNORMAL HIGH (ref 70–99)
Glucose-Capillary: 278 mg/dL — ABNORMAL HIGH (ref 70–99)
Glucose-Capillary: 167 mg/dL — ABNORMAL HIGH (ref 70–99)

## 2021-09-14 LAB — CLOSTRIDIUM DIFFICILE BY PCR, REFLEXED: Toxigenic C. Difficile by PCR: NEGATIVE

## 2021-09-14 LAB — HEMOGLOBIN A1C
Hgb A1c MFr Bld: 10.3 % — ABNORMAL HIGH (ref 4.8–5.6)
Mean Plasma Glucose: 248.91 mg/dL

## 2021-09-14 LAB — PROCALCITONIN: Procalcitonin: 0.42 ng/mL

## 2021-09-14 LAB — RESP PANEL BY RT-PCR (FLU A&B, COVID) ARPGX2
Influenza A by PCR: NEGATIVE
Influenza B by PCR: NEGATIVE
SARS Coronavirus 2 by RT PCR: NEGATIVE

## 2021-09-14 LAB — MRSA NEXT GEN BY PCR, NASAL: MRSA by PCR Next Gen: NOT DETECTED

## 2021-09-14 LAB — VITAMIN D 25 HYDROXY (VIT D DEFICIENCY, FRACTURES): Vit D, 25-Hydroxy: 17.6 ng/mL — ABNORMAL LOW (ref 30–100)

## 2021-09-14 LAB — HIV ANTIBODY (ROUTINE TESTING W REFLEX): HIV Screen 4th Generation wRfx: NONREACTIVE

## 2021-09-14 LAB — CORTISOL-AM, BLOOD: Cortisol - AM: 17.9 ug/dL (ref 6.7–22.6)

## 2021-09-14 LAB — MAGNESIUM: Magnesium: 1.8 mg/dL (ref 1.7–2.4)

## 2021-09-14 MED ORDER — VANCOMYCIN HCL IN DEXTROSE 1-5 GM/200ML-% IV SOLN
1000.0000 mg | Freq: Two times a day (BID) | INTRAVENOUS | Status: DC
  Administered 2021-09-14 – 2021-09-15 (×2): 1000 mg via INTRAVENOUS
  Filled 2021-09-14 (×2): qty 200

## 2021-09-14 MED ORDER — ONDANSETRON HCL 4 MG PO TABS: 4.0000 mg | ORAL_TABLET | Freq: Four times a day (QID) | ORAL | Status: DC | PRN

## 2021-09-14 MED ORDER — INFLUENZA VAC SPLIT QUAD 0.5 ML IM SUSY
0.5000 mL | PREFILLED_SYRINGE | INTRAMUSCULAR | Status: AC
  Administered 2021-09-15: 0.5 mL via INTRAMUSCULAR
  Filled 2021-09-14: qty 0.5

## 2021-09-14 MED ORDER — GUAIFENESIN ER 600 MG PO TB12
1200.0000 mg | ORAL_TABLET | Freq: Two times a day (BID) | ORAL | Status: DC
  Administered 2021-09-14 – 2021-09-16 (×4): 1200 mg via ORAL
  Filled 2021-09-14 (×5): qty 2

## 2021-09-14 MED ORDER — LACTATED RINGERS IV SOLN: INTRAVENOUS | Status: AC

## 2021-09-14 MED ORDER — ACETAMINOPHEN 325 MG PO TABS: 650.0000 mg | ORAL_TABLET | Freq: Four times a day (QID) | ORAL | Status: DC | PRN

## 2021-09-14 MED ORDER — IPRATROPIUM-ALBUTEROL 0.5-2.5 (3) MG/3ML IN SOLN
3.0000 mL | Freq: Four times a day (QID) | RESPIRATORY_TRACT | Status: DC
  Administered 2021-09-14 – 2021-09-15 (×5): 3 mL via RESPIRATORY_TRACT
  Filled 2021-09-14 (×7): qty 3

## 2021-09-14 MED ORDER — SODIUM CHLORIDE 0.9 % IV SOLN
2.0000 g | Freq: Once | INTRAVENOUS | Status: AC
  Administered 2021-09-14: 2 g via INTRAVENOUS
  Filled 2021-09-14: qty 2

## 2021-09-14 MED ORDER — SODIUM CHLORIDE 0.9 % IV SOLN
2.0000 g | Freq: Three times a day (TID) | INTRAVENOUS | Status: DC
  Administered 2021-09-14 – 2021-09-15 (×3): 2 g via INTRAVENOUS
  Filled 2021-09-14 (×3): qty 2

## 2021-09-14 MED ORDER — ACETAMINOPHEN 650 MG RE SUPP: 650.0000 mg | Freq: Four times a day (QID) | RECTAL | Status: DC | PRN

## 2021-09-14 MED ORDER — HEPARIN SODIUM (PORCINE) 5000 UNIT/ML IJ SOLN
5000.0000 [IU] | Freq: Three times a day (TID) | INTRAMUSCULAR | Status: DC
  Administered 2021-09-14 – 2021-09-16 (×7): 5000 [IU] via SUBCUTANEOUS
  Filled 2021-09-14 (×7): qty 1

## 2021-09-14 MED ORDER — INSULIN GLARGINE-YFGN 100 UNIT/ML ~~LOC~~ SOLN
16.0000 [IU] | Freq: Every day | SUBCUTANEOUS | Status: DC
  Administered 2021-09-14 – 2021-09-15 (×2): 16 [IU] via SUBCUTANEOUS
  Filled 2021-09-14 (×4): qty 0.16

## 2021-09-14 MED ORDER — INSULIN ASPART 100 UNIT/ML IJ SOLN
6.0000 [IU] | Freq: Three times a day (TID) | INTRAMUSCULAR | Status: DC
  Administered 2021-09-14 – 2021-09-15 (×2): 6 [IU] via SUBCUTANEOUS

## 2021-09-14 MED ORDER — OXYCODONE HCL 5 MG PO TABS: 5.0000 mg | ORAL_TABLET | ORAL | Status: DC | PRN

## 2021-09-14 MED ORDER — SODIUM CHLORIDE 0.9 % IV SOLN
INTRAVENOUS | Status: DC | PRN
  Administered 2021-09-14: 500 mL via INTRAVENOUS

## 2021-09-14 MED ORDER — ONDANSETRON HCL 4 MG/2ML IJ SOLN: 4.0000 mg | Freq: Four times a day (QID) | INTRAMUSCULAR | Status: DC | PRN

## 2021-09-14 MED ORDER — VANCOMYCIN HCL 1500 MG/300ML IV SOLN
1500.0000 mg | Freq: Once | INTRAVENOUS | Status: AC
  Administered 2021-09-14: 1500 mg via INTRAVENOUS
  Filled 2021-09-14: qty 300

## 2021-09-14 MED ORDER — LIVING WELL WITH DIABETES BOOK: Freq: Once | Status: AC

## 2021-09-14 NOTE — H&P (Signed)
TRH H&P    Patient Demographics:    Howard Cameron, is a 60 y.o. male  MRN: [id number]  DOB - February 07, 1961  Admit Date - 09/13/2021  Referring MD/NP/PA: Kommor  Outpatient Primary MD for the patient is Pcp, No  Patient coming from: Home  Chief complaint-chills   HPI:    Howard Cameron  is a 60 y.o. male, with history of diabetes mellitus type 2, presents the ED with a chief complaint of chills and pneumonia.  Patient reports that started 2 weeks ago.  His chills been getting worse and now associated with rigors.  Patient also has body aches.  He reports measured fevers at home as high as 102.5.  He has associated dry cough and dyspnea on exertion.  He said no change in hearing, no facial congestion, no rhinorrhea.  He reports he is fatigued, and can barely walk on his own secondary to weakness.  Is also been going on for 2 weeks.  Patient had been seen in the outpatient world for the same complaint.  He had been given cefdinir and prednisone and took that for 3-1/2 days.  It caused diarrhea, so he stopped it.  He was seen again and given Zithromax from the ED.  He took that for a couple of days and is also not felt any better.  He really presents to the ED today for the same complaint.  He reports he still having the diarrhea.  It so bad that he sometimes incontinent.  It happens 2-5 times per day.  He has an associated burning and rawness from so much diarrhea.  He describes diarrhea as liquid and dark.  He did provide a sample in the ED.  He reports he was vomiting for the first couple of days but he has not been since then.  Reports that eating would make him more nauseous, so his last normal meal was approximately 9 days ago.  Patient has not been taking any diabetes medications at home.  Patient is a current smoker he smokes 1-1/2 packs/day.  Since being sick he has been smoking about 2-3 cigarettes/day.  Patient  encouraged to use that moment him to quit smoking.  He has not use illicit drugs.  Patient reports that he drinks 2-3 times a week but has never had DTs or withdrawals.  He is vaccinated for COVID.  He is full code.  In the ED Temp 98.5, heart rate 113-127, respiratory 20-26, blood pressure 144/93, satting 93% No leukocytosis with a white blood cell count of 8.3, hemoglobin 14.0 Chemistry panel is unremarkable Lactic acid initially 2.2 and then 1.8 Glucose was high at 305 so beta hydroxybutyrate was done which was 1.94 UA pending Urine culture pending Blood culture pending Chest x-ray shows stable extensive perihilar pulm infiltrate likely infectious.  Developing mild right-sided volume loss EKG shows a heart rate of 117, sinus tach, QTC 451 Admission requested for further work-up and treatment of pneumonia     Review of systems:    In addition to the HPI above,  Admits to  fever and chills No Headache, No changes with Vision or hearing, No problems swallowing food or Liquids, No Chest pain, admits to cough and shortness of breath No Abdominal pain, admits to nausea, vomiting, diarrhea  No Blood in stool or Urine, No dysuria, No new skin rashes or bruises, No new joints pains-aches,  No new asymmetric weakness, tingling, numbness in any extremity, No recent weight gain or loss, No polyuria, polydypsia or polyphagia, No significant Mental Stressors.  All other systems reviewed and are negative.    Past History of the following :    Past Medical History:  Diagnosis Date   Diabetes mellitus without complication (HCC)    Pneumonia       History reviewed. No pertinent surgical history.    Social History:      Social History   Tobacco Use   Smoking status: Every Day    Packs/day: 0.50    Types: Cigarettes   Smokeless tobacco: Never  Substance Use Topics   Alcohol use: Yes    Comment: couple of drinks a night       Family History :    No family history on  file. Family history hypertension   Home Medications:   Prior to Admission medications   Medication Sig Start Date End Date Taking? Authorizing Provider  azithromycin (ZITHROMAX Z-PAK) 250 MG tablet Take 1 tablet (250 mg total) by mouth daily for 4 days. 2 po day one, then 1 daily x 4 days 09/11/21 09/15/21  Arthor Captain, PA-C  ondansetron (ZOFRAN) 4 MG tablet Take 1 tablet (4 mg total) by mouth every 8 (eight) hours as needed for nausea or vomiting. 09/11/21   Arthor Captain, PA-C     Allergies:    No Known Allergies   Physical Exam:   Vitals  Blood pressure (!) 133/98, pulse 99, temperature 98.3 F (36.8 C), temperature source Oral, resp. rate (!) 24, height  (1.778 m), weight 82.2 kg, SpO2 93 %.  1.  General: Patient lying supine in bed,  no acute distress   2. Psychiatric: Alert and oriented x 3, mood and behavior normal for situation, pleasant and cooperative with exam   3. Neurologic: Speech and language are normal, face is symmetric, moves all 4 extremities voluntarily, at baseline without acute deficits on limited exam   4. HEENMT:  Head is atraumatic, normocephalic, pupils reactive to light, neck is supple, trachea is midline, mucous membranes are moist   5. Respiratory : Lungs are diminished in the lower lung fields bilaterally without wheezing, rhonchi, rales, no cyanosis, no increase in work of breathing or accessory muscle use   6. Cardiovascular : Heart rate tachycardic, rhythm is regular, no murmurs, rubs or gallops, no peripheral edema, peripheral pulses palpated   7. Gastrointestinal:  Abdomen is soft, nondistended, nontender to palpation bowel sounds active, no masses or organomegaly palpated   8. Skin:  Skin is warm, dry and intact without rashes, acute lesions, or ulcers on limited exam   9.Musculoskeletal:  No acute deformities or trauma, no asymmetry in tone, no peripheral edema, peripheral pulses palpated, no tenderness to palpation in the  extremities     Data Review:    CBC Recent Labs  Lab 09/11/21 1630 09/13/21 2035  WBC 8.0 8.3  HGB 13.9 14.0  HCT 39.5 41.8  PLT 137* 205  MCV 100.0 103.2*  MCH 35.2* 34.6*  MCHC 35.2 33.5  RDW 11.9 12.2  LYMPHSABS 0.4* 0.9  MONOABS 0.7 0.7  EOSABS 0.0 0.1  BASOSABS 0.0 0.1   ------------------------------------------------------------------------------------------------------------------  Results for orders placed or performed during the hospital encounter of 09/13/21 (from the past 48 hour(s))  CBG monitoring, ED     Status: Abnormal   Collection Time: 09/13/21  8:28 PM  Result Value Ref Range   Glucose-Capillary 319 (H) 70 - 99 mg/dL    Comment: Glucose reference range applies only to samples taken after fasting for at least 8 hours.  Lactic acid, plasma     Status: Abnormal   Collection Time: 09/13/21  8:35 PM  Result Value Ref Range   Lactic Acid, Venous 2.2 (HH) 0.5 - 1.9 mmol/L    Comment: CRITICAL RESULT CALLED TO, READ BACK BY AND VERIFIED WITH: TEASLEY,B ON 09/13/21 AT 2200 BY LOY,C Performed at [facility], [address] [place], [place] [address]   Comprehensive metabolic panel     Status: Abnormal   Collection Time: 09/13/21  8:35 PM  Result Value Ref Range   Sodium 126 (L) 135 - 145 mmol/L   Potassium 3.7 3.5 - 5.1 mmol/L   Chloride 92 (L) 98 - 111 mmol/L   CO2 28 22 - 32 mmol/L   Glucose, Bld 305 (H) 70 - 99 mg/dL    Comment: Glucose reference range applies only to samples taken after fasting for at least 8 hours.   BUN 24 (H) 8 - 23 mg/dL   Creatinine, Ser 1.91 0.61 - 1.24 mg/dL   Calcium 7.9 (L) 8.9 - 10.3 mg/dL   Total Protein 7.0 6.5 - 8.1 g/dL   Albumin 2.7 (L) 3.5 - 5.0 g/dL   AST 478 (H) 15 - 41 U/L   ALT 72 (H) 0 - 44 U/L   Alkaline Phosphatase 93 38 - 126 U/L   Total Bilirubin 1.0 0.3 - 1.2 mg/dL   GFR, Estimated >29 >56 mL/min    Comment: (NOTE) Calculated using the CKD-EPI Creatinine Equation (2021)    Anion gap 6 5 - 15     Comment: Performed at [facility], [address], [place], [place] [address]  CBC WITH DIFFERENTIAL     Status: Abnormal   Collection Time: 09/13/21  8:35 PM  Result Value Ref Range   WBC 8.3 4.0 - 10.5 K/uL   RBC 4.05 (L) 4.22 - 5.81 MIL/uL   Hemoglobin 14.0 13.0 - 17.0 g/dL   HCT 65.7 84.6 - 96.2 %   MCV 103.2 (H) 80.0 - 100.0 fL   MCH 34.6 (H) 26.0 - 34.0 pg   MCHC 33.5 30.0 - 36.0 g/dL   RDW 95.2 84.1 - 32.4 %   Platelets 205 150 - 400 K/uL   nRBC 0.0 0.0 - 0.2 %   Neutrophils Relative % 76 %   Neutro Abs 6.4 1.7 - 7.7 K/uL   Lymphocytes Relative 11 %   Lymphs Abs 0.9 0.7 - 4.0 K/uL   Monocytes Relative 8 %   Monocytes Absolute 0.7 0.1 - 1.0 K/uL   Eosinophils Relative 2 %   Eosinophils Absolute 0.1 0.0 - 0.5 K/uL   Basophils Relative 1 %   Basophils Absolute 0.1 0.0 - 0.1 K/uL   WBC Morphology TOXIC GRANULATION    RBC Morphology MORPHOLOGY UNREMARKABLE    Immature Granulocytes 2 %   Abs Immature Granulocytes 0.12 (H) 0.00 - 0.07 K/uL    Comment: Performed at [facility], [address] [place], [place] [address]  Protime-INR     Status: None   Collection Time: 09/13/21  8:35 PM  Result Value  Ref Range   Prothrombin Time 13.5 11.4 - 15.2 seconds   INR 1.0 0.8 - 1.2    Comment: (NOTE) INR goal varies based on device and disease states. Performed at [facility], [address], [place], [place] [address]   APTT     Status: None   Collection Time: 09/13/21  8:35 PM  Result Value Ref Range   aPTT 29 24 - 36 seconds    Comment: Performed at [facility], [address] [place], [place] [address]  Blood Culture (routine x 2)     Status: None (Preliminary result)   Collection Time: 09/13/21  8:35 PM   Specimen: BLOOD RIGHT FOREARM  Result Value Ref Range   Specimen Description BLOOD RIGHT FOREARM    Special Requests      Blood Culture results may not be optimal due to an inadequate volume of blood received in culture bottles BOTTLES DRAWN AEROBIC AND ANAEROBIC    Culture      NO GROWTH < 12 HOURS Performed at [facility], [address], [place], [place] [address]    Report Status PENDING   Blood Culture (routine x 2)     Status: None (Preliminary result)   Collection Time: 09/13/21  8:35 PM   Specimen: BLOOD RIGHT ARM  Result Value Ref Range   Specimen Description BLOOD RIGHT ARM    Special Requests      Blood Culture adequate volume BOTTLES DRAWN AEROBIC AND ANAEROBIC   Culture      NO GROWTH < 12 HOURS Performed at [facility], [address] [place], [place] [address]    Report Status PENDING   Beta-hydroxybutyric acid     Status: Abnormal   Collection Time: 09/13/21  8:35 PM  Result Value Ref Range   Beta-Hydroxybutyric Acid 1.94 (H) 0.05 - 0.27 mmol/L    Comment: Performed at [facility], [address] [place], [place] [address]  Urinalysis, Routine w reflex microscopic Urine, Clean Catch     Status: Abnormal   Collection Time: 09/13/21 10:06 PM  Result Value Ref Range   Color, Urine YELLOW YELLOW   APPearance CLEAR CLEAR   Specific Gravity, Urine 1.023 1.005 - 1.030   pH 6.0 5.0 - 8.0   Glucose, UA >=500 (A) NEGATIVE mg/dL   Hgb urine dipstick SMALL (A) NEGATIVE   Bilirubin Urine NEGATIVE NEGATIVE   Ketones, ur 20 (A) NEGATIVE mg/dL   Protein, ur 30 (A) NEGATIVE mg/dL   Nitrite NEGATIVE NEGATIVE   Leukocytes,Ua NEGATIVE NEGATIVE   RBC / HPF 0-5 0 - 5 RBC/hpf   WBC, UA 0-5 0 - 5 WBC/hpf   Bacteria, UA RARE (A) NONE SEEN   Mucus PRESENT    Hyaline Casts, UA PRESENT     Comment: Performed at [facility], [address], [place], [place] [address]  C Difficile Quick Screen w PCR reflex     Status: Abnormal   Collection Time: 09/13/21 10:13 PM   Specimen: STOOL  Result Value Ref Range   C Diff antigen POSITIVE (A) NEGATIVE   C Diff toxin NEGATIVE NEGATIVE   C Diff interpretation Results are indeterminate. See PCR results.     Comment: Performed at [facility], [address] [place], [place] [address]  Lactic acid,  plasma     Status: None   Collection Time: 09/13/21 10:35 PM  Result Value Ref Range   Lactic Acid, Venous 1.8 0.5 - 1.9 mmol/L    Comment: Performed at [facility],  [address], [place], [place] [address]  Resp Panel by RT-PCR (Flu A&B, Covid) Nasopharyngeal Swab     Status: None   Collection Time: 09/13/21 11:26 PM   Specimen: Nasopharyngeal Swab; Nasopharyngeal(NP) swabs in vial transport medium  Result Value Ref Range   SARS Coronavirus 2 by RT PCR NEGATIVE NEGATIVE    Comment: (NOTE) SARS-CoV-2 target nucleic acids are NOT DETECTED.  The SARS-CoV-2 RNA is generally detectable in upper respiratory specimens during the acute phase of infection. The lowest concentration of SARS-CoV-2 viral copies this assay can detect is 138 copies/mL. A negative result does not preclude SARS-Cov-2 infection and should not be used as the sole basis for treatment or other patient management decisions. A negative result may occur with  improper specimen collection/handling, submission of specimen other than nasopharyngeal swab, presence of viral mutation(s) within the areas targeted by this assay, and inadequate number of viral copies(<138 copies/mL). A negative result must be combined with clinical observations, patient history, and epidemiological information. The expected result is Negative.  Fact Sheet for Patients:  BloggerCourse.com  Fact Sheet for Healthcare Providers:  SeriousBroker.it  This test is no t yet approved or cleared by the [place] FDA and  has been authorized for detection and/or diagnosis of SARS-CoV-2 by FDA under an Emergency Use Authorization (EUA). This EUA will remain  in effect (meaning this test can be used) for the duration of the COVID-19 declaration under Section 564(b)(1) of the Act, 21 U.S.C.section 360bbb-3(b)(1), unless the authorization is terminated  or revoked sooner.       Influenza A by PCR  NEGATIVE NEGATIVE   Influenza B by PCR NEGATIVE NEGATIVE    Comment: (NOTE) The Xpert Xpress SARS-CoV-2/FLU/RSV plus assay is intended as an aid in the diagnosis of influenza from Nasopharyngeal swab specimens and should not be used as a sole basis for treatment. Nasal washings and aspirates are unacceptable for Xpert Xpress SARS-CoV-2/FLU/RSV testing.  Fact Sheet for Patients: BloggerCourse.com  Fact Sheet for Healthcare Providers: SeriousBroker.it  This test is not yet approved or cleared by the [place] FDA and has been authorized for detection and/or diagnosis of SARS-CoV-2 by FDA under an Emergency Use Authorization (EUA). This EUA will remain in effect (meaning this test can be used) for the duration of the COVID-19 declaration under Section 564(b)(1) of the Act, 21 U.S.C. section 360bbb-3(b)(1), unless the authorization is terminated or revoked.  Performed at [facility], [address], [place], [place] [address]   Glucose, capillary     Status: Abnormal   Collection Time: 09/14/21  1:49 AM  Result Value Ref Range   Glucose-Capillary 236 (H) 70 - 99 mg/dL    Comment: Glucose reference range applies only to samples taken after fasting for at least 8 hours.    Chemistries  Recent Labs  Lab 09/11/21 1630 09/11/21 1830 09/13/21 2035  NA 124* 129* 126*  K 4.2 4.2 3.7  CL 90* 96* 92*  CO2 20* 19* 28  GLUCOSE 590* 474* 305*  BUN 35* 33* 24*  CREATININE 1.15 1.08 0.93  CALCIUM 7.8* 7.7* 7.9*  MG  --  2.3  --   AST 38  --  112*  ALT 37  --  72*  ALKPHOS 83  --  93  BILITOT 1.0  --  1.0   ------------------------------------------------------------------------------------------------------------------  ------------------------------------------------------------------------------------------------------------------ GFR: Estimated Creatinine Clearance: 86.1 mL/min (by C-G formula based on SCr of 0.93  mg/dL). Liver Function Tests: Recent Labs  Lab 09/11/21 1630 09/13/21 2035  AST 38 112*  ALT 37 72*  ALKPHOS 83 93  BILITOT 1.0 1.0  PROT 6.5 7.0  ALBUMIN 2.5* 2.7*   No results for input(s): LIPASE, AMYLASE in the last 168 hours. No results for input(s): AMMONIA in the last 168 hours. Coagulation Profile: Recent Labs  Lab 09/11/21 1630 09/13/21 2035  INR 1.1 1.0   Cardiac Enzymes: No results for input(s): CKTOTAL, CKMB, CKMBINDEX, TROPONINI in the last 168 hours. BNP (last 3 results) No results for input(s): PROBNP in the last 8760 hours. HbA1C: No results for input(s): HGBA1C in the last 72 hours. CBG: Recent Labs  Lab 09/11/21 1632 09/11/21 1815 09/13/21 2028 09/14/21 0149  GLUCAP 520* 453* 319* 236*   Lipid Profile: No results for input(s): CHOL, HDL, LDLCALC, TRIG, CHOLHDL, LDLDIRECT in the last 72 hours. Thyroid Function Tests: No results for input(s): TSH, T4TOTAL, FREET4, T3FREE, THYROIDAB in the last 72 hours. Anemia Panel: No results for input(s): VITAMINB12, FOLATE, FERRITIN, TIBC, IRON, RETICCTPCT in the last 72 hours.  --------------------------------------------------------------------------------------------------------------- Urine analysis:    Component Value Date/Time   COLORURINE YELLOW 09/13/2021 2206   APPEARANCEUR CLEAR 09/13/2021 2206   LABSPEC 1.023 09/13/2021 2206   PHURINE 6.0 09/13/2021 2206   GLUCOSEU >=500 (A) 09/13/2021 2206   HGBUR SMALL (A) 09/13/2021 2206   BILIRUBINUR NEGATIVE 09/13/2021 2206   KETONESUR 20 (A) 09/13/2021 2206   PROTEINUR 30 (A) 09/13/2021 2206   NITRITE NEGATIVE 09/13/2021 2206   LEUKOCYTESUR NEGATIVE 09/13/2021 2206      Imaging Results:    DG Chest Port 1 View  Result Date: 09/13/2021 CLINICAL DATA:  Sepsis EXAM: PORTABLE CHEST 1 VIEW COMPARISON:  09/11/2021 FINDINGS: Mild right-sided volume loss has developed. Extensive right perihilar pulmonary infiltrate is again seen, likely infectious in the  acute setting. No pneumothorax or pleural effusion. Left lung is clear. Cardiac size within normal limits. Pulmonary vascularity is normal. No acute bone abnormality. IMPRESSION: Stable extensive right perihilar pulmonary infiltrate, likely infectious in the acute setting. Developing mild right-sided volume loss. Electronically Signed   By: Helyn Numbers M.D.   On: 09/13/2021 21:43      Assessment & Plan:    Active Problems:   Pneumonia   Sepsis (HCC)   Diabetes mellitus type 2 in nonobese (HCC)   Protein calorie malnutrition (HCC)   Sepsis secondary to pneumonia SIRS criteria heart rate is tachycardic, tachypneic,  Most likely source of infection is pneumonia Life threatening organ dysfunction 2/2 infection is evidenced by: Lactic acid 2.2, transaminitis Antibiotics started Rocephin and Zithromax initially started, the patient has been on both of those classes of medications outpatient, so escalated antibiotics to Vanco and cefepime Fluids given prior to admission 1 L LR bolus Continue LR 150 mils per hour SOFA Score 1  Sepsis order set utilized Monitor this patient on telemetry level of care Pneumonia Culture expectorated sputum Urine antigens Continue treatment with Vanco and cefepime Diabetes mellitus type 2 Glucose 305 with a pseudohyponatremia 126 that corrects to 131 Sliding scale coverage Heart healthy/carb modified diet Protein calorie malnutrition-moderate Control nausea, encourage p.o. intake    DVT Prophylaxis-   Heparin- SCDs   AM Labs Ordered, also please review Full Orders  Family Communication: Admission, patients condition and plan of care including tests being ordered have been discussed with the patient and wife who indicate understanding and agree with the plan and Code Status.  Code Status: Full  Admission status: Observation/Inpatient :The appropriate admission status for this patient is INPATIENT. Inpatient status is  judged to be reasonable and  necessary in order to provide the required intensity of service to ensure the patient's safety. The patient's presenting symptoms, physical exam findings, and initial radiographic and laboratory data in the context of their chronic comorbidities is felt to place them at high risk for further clinical deterioration. Furthermore, it is not anticipated that the patient will be medically stable for discharge from the hospital within 2 midnights of admission. The following factors support the admission status of inpatient.     The patient's presenting symptoms include chills and weakness. The worrisome physical exam findings include tachycardia, SIRS criteria. The initial radiographic and laboratory data are worrisome because of pneumonia on chest x-ray, worsening. The chronic co-morbidities include diabetes mellitus type 2.       * I certify that at the point of admission it is my clinical judgment that the patient will require inpatient hospital care spanning beyond 2 midnights from the point of admission due to high intensity of service, high risk for further deterioration and high frequency of surveillance required.*  Time spent in minutes : 65   Jeremaine Maraj B Zierle-Ghosh DO

## 2021-09-14 NOTE — Progress Notes (Signed)
Had 14 beat run of Vtach earlier.  Saw Dr. Laural Benes in hallway and reported

## 2021-09-14 NOTE — Progress Notes (Signed)
Inpatient Diabetes Program Recommendations  AACE/ADA: New Consensus Statement on Inpatient Glycemic Control (2015)  Target Ranges:  Prepandial:   less than 140 mg/dL      Peak postprandial:   less than 180 mg/dL (1-2 hours)      Critically ill patients:  140 - 180 mg/dL   Lab Results  Component Value Date   GLUCAP 206 (H) 09/14/2021    Review of Glycemic Control  Diabetes history: DM2 Outpatient Diabetes medications: Diet controlled Current orders for Inpatient glycemic control: Novolog correction scale 0-15 units tid + hs 0-5 units  Inpatient Diabetes Program Recommendations:   Pending A1c Ordered Living well With Diabetes for patient review. Consider: -semglee 16 units daily (0.2 units/kg x 82.2 kg)  Will follow during hospitalization.  Thank you, Billy Fischer. Audra Kagel, RN, MSN, CDE  Diabetes Coordinator Inpatient Glycemic Control Team Team Pager [phone] (8am-5pm) 09/14/2021 9:35 AM

## 2021-09-14 NOTE — Progress Notes (Signed)
ASSUMPTION OF CARE NOTE   09/14/2021 4:08 PM  Howard Cameron was seen and examined.  The H&P by the admitting provider, orders, imaging was reviewed.  Please see new orders.  Will continue to follow.  Check MRSA screen.   Vitals:   09/14/21 0609 09/14/21 1438  BP: 129/72 126/63  Pulse: 97 95  Resp:  18  Temp: 98.9 F (37.2 C) 97.9 F (36.6 C)  SpO2: 90% 93%    Results for orders placed or performed during the hospital encounter of 09/13/21  Blood Culture (routine x 2)   Specimen: BLOOD RIGHT FOREARM  Result Value Ref Range   Specimen Description BLOOD RIGHT FOREARM    Special Requests      Blood Culture results may not be optimal due to an inadequate volume of blood received in culture bottles BOTTLES DRAWN AEROBIC AND ANAEROBIC   Culture      NO GROWTH < 12 HOURS Performed at [facility], [address] [place], [place] [address]    Report Status PENDING   Blood Culture (routine x 2)   Specimen: BLOOD RIGHT ARM  Result Value Ref Range   Specimen Description BLOOD RIGHT ARM    Special Requests      Blood Culture adequate volume BOTTLES DRAWN AEROBIC AND ANAEROBIC   Culture      NO GROWTH < 12 HOURS Performed at [facility], [address], [place], [place] [address]    Report Status PENDING   C Difficile Quick Screen w PCR reflex   Specimen: STOOL  Result Value Ref Range   C Diff antigen POSITIVE (A) NEGATIVE   C Diff toxin NEGATIVE NEGATIVE   C Diff interpretation Results are indeterminate. See PCR results.   Resp Panel by RT-PCR (Flu A&B, Covid) Nasopharyngeal Swab   Specimen: Nasopharyngeal Swab; Nasopharyngeal(NP) swabs in vial transport medium  Result Value Ref Range   SARS Coronavirus 2 by RT PCR NEGATIVE NEGATIVE   Influenza A by PCR NEGATIVE NEGATIVE   Influenza B by PCR NEGATIVE NEGATIVE  C. Diff by PCR, Reflexed  Result Value Ref Range   Toxigenic C. Difficile by PCR NEGATIVE NEGATIVE  Lactic acid, plasma  Result Value Ref Range   Lactic Acid,  Venous 2.2 (HH) 0.5 - 1.9 mmol/L  Lactic acid, plasma  Result Value Ref Range   Lactic Acid, Venous 1.8 0.5 - 1.9 mmol/L  Comprehensive metabolic panel  Result Value Ref Range   Sodium 126 (L) 135 - 145 mmol/L   Potassium 3.7 3.5 - 5.1 mmol/L   Chloride 92 (L) 98 - 111 mmol/L   CO2 28 22 - 32 mmol/L   Glucose, Bld 305 (H) 70 - 99 mg/dL   BUN 24 (H) 8 - 23 mg/dL   Creatinine, Ser 7.34 0.61 - 1.24 mg/dL   Calcium 7.9 (L) 8.9 - 10.3 mg/dL   Total Protein 7.0 6.5 - 8.1 g/dL   Albumin 2.7 (L) 3.5 - 5.0 g/dL   AST 193 (H) 15 - 41 U/L   ALT 72 (H) 0 - 44 U/L   Alkaline Phosphatase 93 38 - 126 U/L   Total Bilirubin 1.0 0.3 - 1.2 mg/dL   GFR, Estimated >79 >02 mL/min   Anion gap 6 5 - 15  CBC WITH DIFFERENTIAL  Result Value Ref Range   WBC 8.3 4.0 - 10.5 K/uL   RBC 4.05 (L) 4.22 - 5.81 MIL/uL   Hemoglobin 14.0 13.0 - 17.0 g/dL   HCT 40.9 73.5 - 32.9 %  MCV 103.2 (H) 80.0 - 100.0 fL   MCH 34.6 (H) 26.0 - 34.0 pg   MCHC 33.5 30.0 - 36.0 g/dL   RDW 45.8 09.9 - 83.3 %   Platelets 205 150 - 400 K/uL   nRBC 0.0 0.0 - 0.2 %   Neutrophils Relative % 76 %   Neutro Abs 6.4 1.7 - 7.7 K/uL   Lymphocytes Relative 11 %   Lymphs Abs 0.9 0.7 - 4.0 K/uL   Monocytes Relative 8 %   Monocytes Absolute 0.7 0.1 - 1.0 K/uL   Eosinophils Relative 2 %   Eosinophils Absolute 0.1 0.0 - 0.5 K/uL   Basophils Relative 1 %   Basophils Absolute 0.1 0.0 - 0.1 K/uL   WBC Morphology TOXIC GRANULATION    RBC Morphology MORPHOLOGY UNREMARKABLE    Immature Granulocytes 2 %   Abs Immature Granulocytes 0.12 (H) 0.00 - 0.07 K/uL  Protime-INR  Result Value Ref Range   Prothrombin Time 13.5 11.4 - 15.2 seconds   INR 1.0 0.8 - 1.2  APTT  Result Value Ref Range   aPTT 29 24 - 36 seconds  Urinalysis, Routine w reflex microscopic Urine, Clean Catch  Result Value Ref Range   Color, Urine YELLOW YELLOW   APPearance CLEAR CLEAR   Specific Gravity, Urine 1.023 1.005 - 1.030   pH 6.0 5.0 - 8.0   Glucose, UA >=500  (A) NEGATIVE mg/dL   Hgb urine dipstick SMALL (A) NEGATIVE   Bilirubin Urine NEGATIVE NEGATIVE   Ketones, ur 20 (A) NEGATIVE mg/dL   Protein, ur 30 (A) NEGATIVE mg/dL   Nitrite NEGATIVE NEGATIVE   Leukocytes,Ua NEGATIVE NEGATIVE   RBC / HPF 0-5 0 - 5 RBC/hpf   WBC, UA 0-5 0 - 5 WBC/hpf   Bacteria, UA RARE (A) NONE SEEN   Mucus PRESENT    Hyaline Casts, UA PRESENT   Beta-hydroxybutyric acid  Result Value Ref Range   Beta-Hydroxybutyric Acid 1.94 (H) 0.05 - 0.27 mmol/L  Hemoglobin A1c  Result Value Ref Range   Hgb A1c MFr Bld 10.3 (H) 4.8 - 5.6 %   Mean Plasma Glucose 248.91 mg/dL  Glucose, capillary  Result Value Ref Range   Glucose-Capillary 236 (H) 70 - 99 mg/dL  HIV Antibody (routine testing w rflx)  Result Value Ref Range   HIV Screen 4th Generation wRfx Non Reactive Non Reactive  Protime-INR  Result Value Ref Range   Prothrombin Time 14.8 11.4 - 15.2 seconds   INR 1.2 0.8 - 1.2  Cortisol-am, blood  Result Value Ref Range   Cortisol - AM 17.9 6.7 - 22.6 ug/dL  Procalcitonin  Result Value Ref Range   Procalcitonin 0.42 ng/mL  Comprehensive metabolic panel  Result Value Ref Range   Sodium 132 (L) 135 - 145 mmol/L   Potassium 4.1 3.5 - 5.1 mmol/L   Chloride 96 (L) 98 - 111 mmol/L   CO2 26 22 - 32 mmol/L   Glucose, Bld 232 (H) 70 - 99 mg/dL   BUN 21 8 - 23 mg/dL   Creatinine, Ser 8.25 0.61 - 1.24 mg/dL   Calcium 8.0 (L) 8.9 - 10.3 mg/dL   Total Protein 5.5 (L) 6.5 - 8.1 g/dL   Albumin 2.1 (L) 3.5 - 5.0 g/dL   AST 61 (H) 15 - 41 U/L   ALT 51 (H) 0 - 44 U/L   Alkaline Phosphatase 74 38 - 126 U/L   Total Bilirubin 0.8 0.3 - 1.2 mg/dL   GFR, Estimated >05 >39  mL/min   Anion gap 10 5 - 15  Magnesium  Result Value Ref Range   Magnesium 1.8 1.7 - 2.4 mg/dL  CBC  Result Value Ref Range   WBC 7.1 4.0 - 10.5 K/uL   RBC 3.50 (L) 4.22 - 5.81 MIL/uL   Hemoglobin 12.3 (L) 13.0 - 17.0 g/dL   HCT 96.0 (L) 45.4 - 09.8 %   MCV 102.3 (H) 80.0 - 100.0 fL   MCH 35.1 (H) 26.0  - 34.0 pg   MCHC 34.4 30.0 - 36.0 g/dL   RDW 11.9 14.7 - 82.9 %   Platelets 209 150 - 400 K/uL   nRBC 0.0 0.0 - 0.2 %  Glucose, capillary  Result Value Ref Range   Glucose-Capillary 206 (H) 70 - 99 mg/dL  Glucose, capillary  Result Value Ref Range   Glucose-Capillary 237 (H) 70 - 99 mg/dL  CBG monitoring, ED  Result Value Ref Range   Glucose-Capillary 319 (H) 70 - 99 mg/dL   Time spent 35 mins   Maryln Manuel, MD [facility]   09/13/2021  8:27 PM How to contact the [facility] Attending or Consulting provider 7A - 7P or covering provider during after hours 7P -7A, for this patient?  Check the care team in [facility] and look for a) attending/consulting [facility] provider listed and b) the [facility] team listed Log into [email or web address] and use [facility] universal password to access. If you do not have the password, please contact the hospital operator. Locate the [facility] provider you are looking for under [facility] and page to a number that you can be directly reached. If you still have difficulty reaching the provider, please page the [facility] (Director on Call) for the Hospitalists listed on amion for assistance.

## 2021-09-14 NOTE — Progress Notes (Signed)
Pharmacy Antibiotic Note  Howard Cameron is a 60 y.o. male admitted on 09/13/2021 with pneumonia that failed outpatient treatment.  Pharmacy has been consulted for vancomycin and cefepime dosing.  Plan: Vancomycin 1500mg  x1 then 1000mg  IV Q12H. Goal AUC 400-550.  Expected AUC 445.  SCr 0.93.  Cefepime 2g IV Q8H.  Height: 5\' 10"  (177.8 cm) Weight: 82.2 kg (181 lb 3.5 oz) IBW/kg (Calculated) : 73  Temp (24hrs), Avg:98.8 F (37.1 C), Min:98.3 F (36.8 C), Max:99.3 F (37.4 C)  Recent Labs  Lab 09/11/21 1630 09/11/21 1830 09/13/21 2035 09/13/21 2235  WBC 8.0  --  8.3  --   CREATININE 1.15 1.08 0.93  --   LATICACIDVEN 1.7 1.7 2.2* 1.8    Estimated Creatinine Clearance: 86.1 mL/min (by C-G formula based on SCr of 0.93 mg/dL).    No Known Allergies   Thank you for allowing pharmacy to be a part of this patient's care.  09/15/21, PharmD, BCPS  09/14/2021 3:49 AM

## 2021-09-15 DIAGNOSIS — E559 Vitamin D deficiency, unspecified: Secondary | ICD-10-CM

## 2021-09-15 LAB — TSH: TSH: 1.55 u[IU]/mL (ref 0.350–4.500)

## 2021-09-15 LAB — URINE CULTURE: Culture: NO GROWTH

## 2021-09-15 LAB — CBC WITH DIFFERENTIAL/PLATELET
Abs Immature Granulocytes: 0.04 10*3/uL (ref 0.00–0.07)
Basophils Absolute: 0.1 10*3/uL (ref 0.0–0.1)
Basophils Relative: 1 %
Eosinophils Absolute: 0.3 10*3/uL (ref 0.0–0.5)
Eosinophils Relative: 5 %
HCT: 45.6 % (ref 39.0–52.0)
Hemoglobin: 15 g/dL (ref 13.0–17.0)
Immature Granulocytes: 1 %
Lymphocytes Relative: 28 %
Lymphs Abs: 1.7 10*3/uL (ref 0.7–4.0)
MCH: 31.1 pg (ref 26.0–34.0)
MCHC: 32.9 g/dL (ref 30.0–36.0)
MCV: 94.4 fL (ref 80.0–100.0)
Monocytes Absolute: 0.4 10*3/uL (ref 0.1–1.0)
Monocytes Relative: 7 %
Neutro Abs: 3.5 10*3/uL (ref 1.7–7.7)
Neutrophils Relative %: 58 %
Platelets: 150 10*3/uL (ref 150–400)
RBC: 4.83 MIL/uL (ref 4.22–5.81)
RDW: 14.2 % (ref 11.5–15.5)
WBC: 6 10*3/uL (ref 4.0–10.5)
nRBC: 0 % (ref 0.0–0.2)

## 2021-09-15 LAB — GLUCOSE, CAPILLARY
Glucose-Capillary: 213 mg/dL — ABNORMAL HIGH (ref 70–99)
Glucose-Capillary: 197 mg/dL — ABNORMAL HIGH (ref 70–99)
Glucose-Capillary: 262 mg/dL — ABNORMAL HIGH (ref 70–99)
Glucose-Capillary: 174 mg/dL — ABNORMAL HIGH (ref 70–99)

## 2021-09-15 LAB — COMPREHENSIVE METABOLIC PANEL
ALT: 17 U/L (ref 0–44)
AST: 18 U/L (ref 15–41)
Albumin: 3.6 g/dL (ref 3.5–5.0)
Alkaline Phosphatase: 74 U/L (ref 38–126)
Anion gap: 6 (ref 5–15)
BUN: 32 mg/dL — ABNORMAL HIGH (ref 8–23)
CO2: 28 mmol/L (ref 22–32)
Calcium: 9.7 mg/dL (ref 8.9–10.3)
Chloride: 105 mmol/L (ref 98–111)
Creatinine, Ser: 1.72 mg/dL — ABNORMAL HIGH (ref 0.61–1.24)
GFR, Estimated: 45 mL/min — ABNORMAL LOW (ref >60)
Glucose, Bld: 97 mg/dL (ref 70–99)
Potassium: 4.2 mmol/L (ref 3.5–5.1)
Sodium: 139 mmol/L (ref 135–145)
Total Bilirubin: 0.8 mg/dL (ref 0.3–1.2)
Total Protein: 6.5 g/dL (ref 6.5–8.1)

## 2021-09-15 MED ORDER — VITAMIN D (ERGOCALCIFEROL) 1.25 MG (50000 UNIT) PO CAPS
50000.0000 [IU] | ORAL_CAPSULE | ORAL | Status: DC
  Administered 2021-09-15: 50000 [IU] via ORAL
  Filled 2021-09-15: qty 1

## 2021-09-15 MED ORDER — NICOTINE 21 MG/24HR TD PT24: 21.0000 mg | MEDICATED_PATCH | Freq: Every day | TRANSDERMAL | Status: DC | PRN

## 2021-09-15 MED ORDER — INSULIN GLARGINE-YFGN 100 UNIT/ML ~~LOC~~ SOLN
20.0000 [IU] | Freq: Every day | SUBCUTANEOUS | Status: DC
  Administered 2021-09-16: 20 [IU] via SUBCUTANEOUS
  Filled 2021-09-15 (×2): qty 0.2

## 2021-09-15 MED ORDER — SODIUM CHLORIDE 0.9 % IV SOLN
2.0000 g | Freq: Two times a day (BID) | INTRAVENOUS | Status: DC
  Administered 2021-09-15 – 2021-09-16 (×2): 2 g via INTRAVENOUS
  Filled 2021-09-15 (×2): qty 2

## 2021-09-15 MED ORDER — INSULIN ASPART 100 UNIT/ML IJ SOLN
8.0000 [IU] | Freq: Three times a day (TID) | INTRAMUSCULAR | Status: DC
  Administered 2021-09-15: 8 [IU] via SUBCUTANEOUS

## 2021-09-15 MED ORDER — LORAZEPAM 2 MG/ML IJ SOLN
1.0000 mg | Freq: Once | INTRAMUSCULAR | Status: AC
  Administered 2021-09-15: 1 mg via INTRAVENOUS
  Filled 2021-09-15: qty 1

## 2021-09-15 NOTE — Progress Notes (Signed)
PROGRESS NOTE   Howard Cameron  PXT:[id number] DOB: 15-Sep-1961 DOA: 09/13/2021 PCP: Pcp, No   Chief Complaint  Patient presents with   Pneumonia   Level of care: Med-Surg  Brief Admission History:  60 y.o. male, with history of diabetes mellitus type 2, presents the ED with a chief complaint of chills and pneumonia.  Patient reports that started 2 weeks ago.  His chills been getting worse and now associated with rigors.  Patient also has body aches.  He reports measured fevers at home as high as 102.5.  He has associated dry cough and dyspnea on exertion.  He said no change in hearing, no facial congestion, no rhinorrhea.  He reports he is fatigued, and can barely walk on his own secondary to weakness.  Is also been going on for 2 weeks.  Patient had been seen in the outpatient world for the same complaint.  He had been given cefdinir and prednisone and took that for 3-1/2 days.  It caused diarrhea, so he stopped it.  He was seen again and given Zithromax from the ED.  He took that for a couple of days and is also not felt any better.  He really presents to the ED today for the same complaint.  He reports he still having the diarrhea.  It so bad that he sometimes incontinent.  It happens 2-5 times per day.  He has an associated burning and rawness from so much diarrhea.  He describes diarrhea as liquid and dark.  He did provide a sample in the ED.  He reports he was vomiting for the first couple of days but he has not been since then.  Reports that eating would make him more nauseous, so his last normal meal was approximately 9 days ago.  Patient has not been taking any diabetes medications at home.   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   Difficult to place patient No  Consultants:  N/a   Procedures:  N/a   Antimicrobials:  Cefepime 9/30>> Vancomycin 9/30-10/1   Subjective: Pt reports cough and chest congestion.   Objective: Vitals:   09/15/21 0409 09/15/21 0745 09/15/21 1400 09/15/21 1403  BP: (!) 111/49  127/71   Pulse: 89  92   Resp: (!) 22  16   Temp: 98.7 F (37.1 C)  97.6  F (36.4 C)   TempSrc: Oral     SpO2: 93% 94% 96% 96%  Weight:      Height:        Intake/Output Summary (Last 24 hours) at 09/15/2021 1525 Last data filed at 09/15/2021 1500 Gross per 24 hour  Intake 3955.22 ml  Output 1400 ml  Net 2555.22 ml   Filed Weights   09/13/21 2022 09/14/21 0042  Weight: 79.4 kg 82.2 kg    Examination:  General exam: Appears calm and comfortable  Respiratory system: Clear to auscultation. Respiratory effort normal. Cardiovascular system: normal S1 & S2 heard. No JVD, murmurs, rubs, gallops or clicks. No pedal edema. Gastrointestinal system: Abdomen is nondistended, soft and nontender. No organomegaly or masses felt. Normal bowel sounds heard. Central nervous system: Alert and oriented. No focal neurological  deficits. Extremities: Symmetric 5 x 5 power. Skin: No rashes, lesions or ulcers Psychiatry: Judgement and insight appear normal. Mood & affect appropriate.   Data Reviewed: I have personally reviewed following labs and imaging studies  CBC: Recent Labs  Lab 09/11/21 1630 09/13/21 2035 09/14/21 0546 09/15/21 0530  WBC 8.0 8.3 7.1 6.0  NEUTROABS 6.8 6.4  --  3.5  HGB 13.9 14.0 12.3* 15.0  HCT 39.5 41.8 35.8* 45.6  MCV 100.0 103.2* 102.3* 94.4  PLT 137* 205 209 150    Basic Metabolic Panel: Recent Labs  Lab 09/11/21 1630 09/11/21 1830 09/13/21 2035 09/14/21 0546 09/15/21 0530  NA 124* 129* 126* 132* 139  K 4.2 4.2 3.7 4.1 4.2  CL 90* 96* 92* 96* 105  CO2 20* 19* 28 26 28   GLUCOSE 590* 474* 305* 232* 97  BUN 35* 33* 24* 21 32*  CREATININE 1.15 1.08 0.93 0.71 1.72*  CALCIUM 7.8* 7.7* 7.9* 8.0* 9.7  MG  --  2.3  --  1.8  --     GFR: Estimated Creatinine Clearance: 46.6 mL/min (A) (by C-G formula based on SCr of 1.72 mg/dL (H)).  Liver Function Tests: Recent Labs  Lab 09/11/21 1630 09/13/21 2035 09/14/21 0546 09/15/21 0530  AST 38 112* 61* 18  ALT 37 72* 51* 17  ALKPHOS 83 93 74 74  BILITOT 1.0 1.0 0.8 0.8  PROT 6.5 7.0 5.5* 6.5  ALBUMIN 2.5* 2.7* 2.1* 3.6    CBG: Recent Labs  Lab 09/14/21 1609 09/14/21 2034 09/15/21 0334 09/15/21 0836 09/15/21 1224  GLUCAP 278* 167* 213* 197* 262*    Recent Results (from the past 240 hour(s))  Blood Culture (routine x 2)     Status: None (Preliminary result)   Collection Time: 09/11/21  4:27 PM   Specimen: BLOOD LEFT ARM  Result Value Ref Range Status   Specimen Description BLOOD LEFT ARM  Final   Special Requests   Final    Immunocompromised BOTTLES DRAWN AEROBIC AND ANAEROBIC Blood Culture results may not be optimal due to an excessive volume of blood received in culture bottles   Culture   Final    NO GROWTH 4 DAYS Performed at [facility], [address] [place], [place]    Report Status  PENDING  Incomplete  Resp Panel by RT-PCR (Flu A&B, Covid) Nasopharyngeal Swab     Status: None   Collection Time: 09/11/21  4:37 PM   Specimen: Nasopharyngeal Swab; Nasopharyngeal(NP) swabs in vial transport medium  Result Value Ref Range Status   SARS Coronavirus 2 by RT PCR NEGATIVE NEGATIVE Final    Comment: (NOTE) SARS-CoV-2 target nucleic acids are NOT DETECTED.  The SARS-CoV-2 RNA is generally detectable in upper respiratory specimens during the acute phase of infection. The lowest concentration of SARS-CoV-2 viral copies this assay can detect is 138 copies/mL. A negative result does not preclude SARS-Cov-2 infection and should not be used as the sole basis for treatment or other patient management decisions. A negative result may occur with  improper specimen collection/handling, submission of specimen other than nasopharyngeal swab, presence of viral mutation(s) within the areas targeted by this assay, and inadequate number of viral copies(<138 copies/mL). A negative result must be combined with clinical observations, patient history, and epidemiological information. The expected result is Negative.  Fact Sheet for Patients:  BloggerCourse.com  Fact Sheet for Healthcare Providers:  SeriousBroker.it  This test is no t yet approved or cleared by the [place] FDA and  has been authorized for detection and/or diagnosis of SARS-CoV-2 by FDA under an Emergency Use Authorization (EUA). This EUA will remain  in effect (meaning this test can be used) for the duration of the COVID-19 declaration under Section 564(b)(1) of the Act, 21 U.S.C.section 360bbb-3(b)(1), unless the authorization is terminated  or revoked sooner.       Influenza A by PCR NEGATIVE NEGATIVE Final   Influenza B by PCR NEGATIVE NEGATIVE Final    Comment: (NOTE) The Xpert Xpress SARS-CoV-2/FLU/RSV plus assay is intended as an aid in the diagnosis of  influenza from Nasopharyngeal swab specimens and should not be used as a sole basis for treatment. Nasal washings and aspirates are unacceptable for Xpert Xpress SARS-CoV-2/FLU/RSV testing.  Fact Sheet for Patients: BloggerCourse.com  Fact Sheet for Healthcare Providers: SeriousBroker.it  This test is not yet approved or cleared by the [place] FDA and has been authorized for detection and/or diagnosis of SARS-CoV-2 by FDA under an Emergency Use Authorization (EUA). This EUA will remain in effect (meaning this test can be used) for the duration of the COVID-19 declaration under Section 564(b)(1) of the Act, 21 U.S.C. section 360bbb-3(b)(1), unless the authorization is terminated or revoked.  Performed at [facility], [address] [place], [place] [address]   Blood Culture (routine x 2)     Status: None (Preliminary result)   Collection Time: 09/11/21  5:25 PM   Specimen: Right Antecubital; Blood  Result Value Ref Range Status   Specimen Description RIGHT ANTECUBITAL  Final   Special Requests   Final    Blood Culture results may not be optimal due to an excessive volume of blood received in culture bottles BOTTLES DRAWN AEROBIC AND ANAEROBIC   Culture   Final    NO GROWTH 4 DAYS Performed at [facility], [address] [place], [place] [address]    Report Status PENDING  Incomplete  Blood Culture (routine x 2)     Status: None (Preliminary result)   Collection Time: 09/13/21  8:35 PM   Specimen: BLOOD RIGHT FOREARM  Result Value Ref Range Status   Specimen Description BLOOD RIGHT FOREARM  Final   Special Requests   Final    Blood Culture results may not be optimal due to an inadequate volume of blood received in culture bottles BOTTLES DRAWN AEROBIC AND ANAEROBIC   Culture   Final    NO GROWTH 2 DAYS Performed at [facility], [address], [place], [place] [address]    Report Status PENDING  Incomplete  Blood  Culture (routine x 2)     Status: None (Preliminary result)   Collection Time: 09/13/21  8:35 PM   Specimen:  BLOOD RIGHT ARM  Result Value Ref Range Status   Specimen Description BLOOD RIGHT ARM  Final   Special Requests   Final    Blood Culture adequate volume BOTTLES DRAWN AEROBIC AND ANAEROBIC   Culture   Final    NO GROWTH 2 DAYS Performed at [facility], [address] [place], [place] [address]    Report Status PENDING  Incomplete  Urine Culture     Status: None   Collection Time: 09/13/21 10:06 PM   Specimen: In/Out Cath Urine  Result Value Ref Range Status   Specimen Description   Final    IN/OUT CATH URINE Performed at [facility], [address], [place], [place] [address]    Special Requests   Final    NONE Performed at [facility], [address] [place], [place] [address]    Culture   Final    NO GROWTH Performed at [facility], [address] [place], [place] [address]    Report Status 09/15/2021 FINAL  Final  C Difficile Quick Screen w PCR reflex     Status: Abnormal   Collection Time: 09/13/21 10:13 PM   Specimen: STOOL  Result Value Ref Range Status   C Diff antigen POSITIVE (A) NEGATIVE Final   C Diff toxin NEGATIVE NEGATIVE Final   C Diff interpretation Results are indeterminate. See PCR results.  Final    Comment: Performed at [facility], [address] [place], [place] [address]  C. Diff by PCR, Reflexed     Status: None   Collection Time: 09/13/21 10:13 PM  Result Value Ref Range Status   Toxigenic C. Difficile by PCR NEGATIVE NEGATIVE Final    Comment: Patient is colonized with non toxigenic C. difficile. May not need treatment unless significant symptoms are present. Performed at [facility], [address], [place], [place] [address]   Resp Panel by RT-PCR (Flu A&B, Covid) Nasopharyngeal Swab     Status: None   Collection Time: 09/13/21 11:26 PM   Specimen: Nasopharyngeal Swab; Nasopharyngeal(NP) swabs in vial transport medium   Result Value Ref Range Status   SARS Coronavirus 2 by RT PCR NEGATIVE NEGATIVE Final    Comment: (NOTE) SARS-CoV-2 target nucleic acids are NOT DETECTED.  The SARS-CoV-2 RNA is generally detectable in upper respiratory specimens during the acute phase of infection. The lowest concentration of SARS-CoV-2 viral copies this assay can detect is 138 copies/mL. A negative result does not preclude SARS-Cov-2 infection and should not be used as the sole basis for treatment or other patient management decisions. A negative result may occur with  improper specimen collection/handling, submission of specimen other than nasopharyngeal swab, presence of viral mutation(s) within the areas targeted by this assay, and inadequate number of viral copies(<138 copies/mL). A negative result must be combined with clinical observations, patient history, and epidemiological information. The expected result is Negative.  Fact Sheet for Patients:  BloggerCourse.com  Fact Sheet for Healthcare Providers:  SeriousBroker.it  This test is no t yet approved or cleared by the [place] FDA and  has been authorized for detection and/or diagnosis of SARS-CoV-2 by FDA under an Emergency Use Authorization (EUA). This EUA will remain  in effect (meaning this test can be used) for the duration of the COVID-19 declaration under Section 564(b)(1) of the Act, 21 U.S.C.section 360bbb-3(b)(1), unless the authorization is terminated  or revoked sooner.       Influenza A by PCR NEGATIVE NEGATIVE Final   Influenza  B by PCR NEGATIVE NEGATIVE Final    Comment: (NOTE) The Xpert Xpress SARS-CoV-2/FLU/RSV plus assay is intended as an aid in the diagnosis of influenza from Nasopharyngeal swab specimens and should not be used as a sole basis for treatment. Nasal washings and aspirates are unacceptable for Xpert Xpress SARS-CoV-2/FLU/RSV testing.  Fact Sheet for  Patients: BloggerCourse.com  Fact Sheet for Healthcare Providers: SeriousBroker.it  This test is not yet approved or cleared by the [place] FDA and has been authorized for detection and/or diagnosis of SARS-CoV-2 by FDA under an Emergency Use Authorization (EUA). This EUA will remain in effect (meaning this test can be used) for the duration of the COVID-19 declaration under Section 564(b)(1) of the Act, 21 U.S.C. section 360bbb-3(b)(1), unless the authorization is terminated or revoked.  Performed at [facility], [address], [place], [place] [address]   MRSA Next Gen by PCR, Nasal     Status: None   Collection Time: 09/14/21 11:54 AM   Specimen: Nasal Mucosa; Nasal Swab  Result Value Ref Range Status   MRSA by PCR Next Gen NOT DETECTED NOT DETECTED Final    Comment: (NOTE) The GeneXpert MRSA Assay (FDA approved for NASAL specimens only), is one component of a comprehensive MRSA colonization surveillance program. It is not intended to diagnose MRSA infection nor to guide or monitor treatment for MRSA infections. Test performance is not FDA approved in patients less than 1 years old. Performed at [facility], [address] [place], [place] [address]      Radiology Studies: [facility] Chest [facility] 1 View  Result Date: 09/13/2021 CLINICAL DATA:  Sepsis EXAM: PORTABLE CHEST 1 VIEW COMPARISON:  09/11/2021 FINDINGS: Mild right-sided volume loss has developed. Extensive right perihilar pulmonary infiltrate is again seen, likely infectious in the acute setting. No pneumothorax or pleural effusion. Left lung is clear. Cardiac size within normal limits. Pulmonary vascularity is normal. No acute bone abnormality. IMPRESSION: Stable extensive right perihilar pulmonary infiltrate, likely infectious in the acute setting. Developing mild right-sided volume loss. Electronically Signed   By: Helyn Numbers M.D.   On: 09/13/2021 21:43    Scheduled  Meds:  guaiFENesin  1,200 mg Oral BID   heparin  5,000 Units Subcutaneous Q8H   insulin aspart  0-15 Units Subcutaneous TID WC   insulin aspart  0-5 Units Subcutaneous QHS   insulin aspart  6 Units Subcutaneous TID WC   insulin glargine-yfgn  16 Units Subcutaneous Daily   ipratropium-albuterol  3 mL Nebulization Q6H   melatonin  6 mg Oral QHS   Vitamin D (Ergocalciferol)  50,000 Units Oral Q7 days   Continuous Infusions:  sodium chloride 500 mL (09/14/21 1243)   ceFEPime (MAXIPIME) IV     lactated ringers 55 mL/hr at 09/15/21 1109     LOS: 2 days   Time spent: 36 mins   Shavette Shoaff Laural Benes, MD How to contact the [facility] Attending or Consulting provider 7A - 7P or covering provider during after hours 7P -7A, for this patient?  Check the care team in [facility] and look for a) attending/consulting TR[facility] provider listed and b) the [facility] team listed Log into [email or web address] and use [place]'s universal password to access. If you do not have the password, please contact the hospital operator. Locate the [facility] provider you are looking for under [facility] and page to a number that you can be directly reached. If you still have difficulty reaching the provider, please page the [facility] (Director on Call) for the Hospitalists  listed on amion for assistance.  09/15/2021, 3:25 PM

## 2021-09-15 NOTE — Progress Notes (Signed)
Patient was found in bed with wet bed sheets and skin was wet with sweat. I asked patient does he sweat like this at home at night and he said "recently".  Notified MD

## 2021-09-16 LAB — COMPREHENSIVE METABOLIC PANEL
ALT: 51 U/L — ABNORMAL HIGH (ref 0–44)
AST: 51 U/L — ABNORMAL HIGH (ref 15–41)
Albumin: 2 g/dL — ABNORMAL LOW (ref 3.5–5.0)
Alkaline Phosphatase: 72 U/L (ref 38–126)
Anion gap: 7 (ref 5–15)
BUN: 10 mg/dL (ref 8–23)
CO2: 28 mmol/L (ref 22–32)
Calcium: 8 mg/dL — ABNORMAL LOW (ref 8.9–10.3)
Chloride: 101 mmol/L (ref 98–111)
Creatinine, Ser: 0.63 mg/dL (ref 0.61–1.24)
GFR, Estimated: >60 mL/min (ref >60)
Glucose, Bld: 191 mg/dL — ABNORMAL HIGH (ref 70–99)
Potassium: 3.1 mmol/L — ABNORMAL LOW (ref 3.5–5.1)
Sodium: 136 mmol/L (ref 135–145)
Total Bilirubin: 0.7 mg/dL (ref 0.3–1.2)
Total Protein: 5.3 g/dL — ABNORMAL LOW (ref 6.5–8.1)

## 2021-09-16 LAB — CULTURE, BLOOD (ROUTINE X 2)
Culture: NO GROWTH
Culture: NO GROWTH

## 2021-09-16 LAB — GLUCOSE, CAPILLARY
Glucose-Capillary: 209 mg/dL — ABNORMAL HIGH (ref 70–99)
Glucose-Capillary: 232 mg/dL — ABNORMAL HIGH (ref 70–99)

## 2021-09-16 LAB — MAGNESIUM: Magnesium: 1.6 mg/dL — ABNORMAL LOW (ref 1.7–2.4)

## 2021-09-16 MED ORDER — GUAIFENESIN ER 600 MG PO TB12: 1200.0000 mg | ORAL_TABLET | Freq: Two times a day (BID) | ORAL | 0 refills | Status: AC

## 2021-09-16 MED ORDER — POTASSIUM CHLORIDE CRYS ER 20 MEQ PO TBCR
40.0000 meq | EXTENDED_RELEASE_TABLET | Freq: Once | ORAL | Status: AC
  Administered 2021-09-16: 40 meq via ORAL
  Filled 2021-09-16: qty 2

## 2021-09-16 MED ORDER — VITAMIN D (ERGOCALCIFEROL) 1.25 MG (50000 UNIT) PO CAPS: 50000.0000 [IU] | ORAL_CAPSULE | ORAL | 0 refills | Status: DC

## 2021-09-16 MED ORDER — BLOOD GLUCOSE METER KIT: PACK | 0 refills | Status: DC

## 2021-09-16 MED ORDER — SODIUM CHLORIDE 0.9 % IV SOLN: 2.0000 g | Freq: Three times a day (TID) | INTRAVENOUS | Status: DC

## 2021-09-16 MED ORDER — IPRATROPIUM-ALBUTEROL 0.5-2.5 (3) MG/3ML IN SOLN: 3.0000 mL | RESPIRATORY_TRACT | Status: DC | PRN

## 2021-09-16 MED ORDER — METFORMIN HCL ER (OSM) 500 MG PO TB24: ORAL_TABLET | ORAL | 1 refills | Status: DC

## 2021-09-16 MED ORDER — NICOTINE 21 MG/24HR TD PT24: 21.0000 mg | MEDICATED_PATCH | Freq: Every day | TRANSDERMAL | 0 refills | Status: AC | PRN

## 2021-09-16 MED ORDER — LEVOFLOXACIN 750 MG PO TABS: 750.0000 mg | ORAL_TABLET | Freq: Every day | ORAL | 0 refills | Status: AC

## 2021-09-16 NOTE — Evaluation (Signed)
Physical Therapy Evaluation Patient Details Name: Howard Cameron MRN: [id number] DOB: Mar 17, 1961 Today's Date: 09/16/2021  History of Present Illness  Howard Cameron  is a 60 y.o. male, with history of diabetes mellitus type 2, presents the ED with a chief complaint of chills and pneumonia.  Patient reports that started 2 weeks ago.  His chills been getting worse and now associated with rigors.  Patient also has body aches.  He reports measured fevers at home as high as 102.5.  He has associated dry cough and dyspnea on exertion.  He said no change in hearing, no facial congestion, no rhinorrhea.  He reports he is fatigued, and can barely walk on his own secondary to weakness.  Is also been going on for 2 weeks.  Patient had been seen in the outpatient world for the same complaint.  He had been given cefdinir and prednisone and took that for 3-1/2 days.  It caused diarrhea, so he stopped it.  He was seen again and given Zithromax from the ED.  He took that for a couple of days and is also not felt any better.  He really presents to the ED today for the same complaint.  He reports he still having the diarrhea.  It so bad that he sometimes incontinent.  It happens 2-5 times per day.  He has an associated burning and rawness from so much diarrhea.  He describes diarrhea as liquid and dark.  He did provide a sample in the ED.  He reports he was vomiting for the first couple of days but he has not been since then.  Reports that eating would make him more nauseous, so his last normal meal was approximately 9 days ago.  Patient has not been taking any diabetes medications at home.  Clinical Impression  Present in room and agreeable to PT. Complete all transitions with supervision to modified independent, use of RW with sit to stand transfers. Increased WB through UE onto RW with ambulation throughout gait, ambulate x100 ft with supervision. No reports of fatigue or pain post ambulation. Pt no longer needs PT and is  discharged to nursing for ambulation.        Recommendations for follow up therapy are one component of a multi-disciplinary discharge planning process, led by the attending physician.  Recommendations may be updated based on patient status, additional functional criteria and insurance authorization.  Follow Up Recommendations Home health PT    Equipment Recommendations  Rolling walker with 5" wheels    Recommendations for Other Services       Precautions / Restrictions Precautions Precautions: Fall Restrictions Weight Bearing Restrictions: No      Mobility  Bed Mobility Overal bed mobility: Independent               Patient Response: Cooperative  Transfers Overall transfer level: Needs assistance Equipment used: Rolling walker (2 wheeled) Transfers: Sit to/from [employer] Sit to Stand: Modified independent (Device/Increase time);Supervision Stand pivot transfers: Modified independent (Device/Increase time);Supervision       General transfer comment: with RW  Ambulation/Gait Ambulation/Gait assistance: Modified independent (Device/Increase time) Gait Distance (Feet): 100 Feet Assistive device: Rolling walker (2 wheeled) Gait Pattern/deviations: Step-through pattern;Trunk flexed Gait velocity: decreased   General Gait Details: increased UE WB onto RW  Stairs            Wheelchair Mobility    Modified Rankin (Stroke Patients Only)       Balance Overall balance assessment: Mild deficits observed, not  formally tested                                           Pertinent Vitals/Pain Pain Assessment: No/denies pain    Home Living Family/patient expects to be discharged to:: Private residence Living Arrangements: Spouse/significant other;Children Available Help at Discharge: Family Type of Home: House Home Access: Stairs to enter Entrance Stairs-Rails: Right Entrance Stairs-Number of Steps: 3 Home Layout: One  level Home Equipment: Cane - single point      Prior Function Level of Independence: Independent               Hand Dominance        Extremity/Trunk Assessment   Upper Extremity Assessment Upper Extremity Assessment: Overall WFL for tasks assessed    Lower Extremity Assessment Lower Extremity Assessment: Overall WFL for tasks assessed    Cervical / Trunk Assessment Cervical / Trunk Assessment: Normal  Communication   Communication: No difficulties  Cognition Arousal/Alertness: Awake/alert Behavior During Therapy: WFL for tasks assessed/performed Overall Cognitive Status: Within Functional Limits for tasks assessed                                        General Comments      Exercises     Assessment/Plan    PT Assessment Patent does not need any further PT services  PT Problem List         PT Treatment Interventions      PT Goals (Current goals can be found in the Care Plan section)  Acute Rehab PT Goals Patient Stated Goal: return home PT Goal Formulation: With patient Time For Goal Achievement: 09/30/21 Potential to Achieve Goals: Good    Frequency     Barriers to discharge        Co-evaluation               AM-PAC PT "6 Clicks" Mobility  Outcome Measure Help needed turning from your back to your side while in a flat bed without using bedrails?: None Help needed moving from lying on your back to sitting on the side of a flat bed without using bedrails?: None Help needed moving to and from a bed to a chair (including a wheelchair)?: A Little Help needed standing up from a chair using your arms (e.g., wheelchair or bedside chair)?: A Little Help needed to walk in hospital room?: A Little Help needed climbing 3-5 steps with a railing? : A Lot 6 Click Score: 19    End of Session   Activity Tolerance: Patient tolerated treatment well Patient left: in bed;with call bell/phone within reach Nurse Communication: Mobility  status PT Visit Diagnosis: Unsteadiness on feet (R26.81)    Time: 1308-6578 PT Time Calculation (min) (ACUTE ONLY): 12 min   Charges:   PT Evaluation $PT Eval Low Complexity: 1 Low          12:18 PM,09/16/21 Esmeralda Links, PT, DPT Physical Therapist at [facility]

## 2021-09-16 NOTE — Discharge Summary (Signed)
Physician Discharge Summary  [name] ITG:[id number] DOB: 1961/02/06 DOA: 09/13/2021   Admit date: 09/13/2021 Discharge date: 09/16/2021  Admitted From:  Home  Disposition: Home   Recommendations for Outpatient Follow-up:  Follow up with PCP in 1-2 weeks Please repeat CXR in 4-6 weeks to ensure resolution of pneumonia Please establish care with endocrinology regarding uncontrolled diabetes Please continue to work on smoking cessation  Discharge Condition: STABLE   CODE STATUS: FULL DIET:  heart healthy carb modified, no concentrated sweets or fruit juices except to treat a low blood glucose   Brief Hospitalization Summary: Please see all hospital notes, images, labs for full details of the hospitalization. ADMISSION HPI:  Howard Cameron, with history of diabetes mellitus type 2, presents the ED with a chief complaint of chills and pneumonia.  Patient reports that started 2 weeks ago.  His chills been getting worse and now associated with rigors.  Patient also has body aches.  He reports measured fevers at home as high as 102.5.  He has associated dry cough and dyspnea on exertion.  He said no change in hearing, no facial congestion, no rhinorrhea.  He reports he is fatigued, and can barely walk on his own secondary to weakness.  Is also been going on for 2 weeks.  Patient had been seen in the outpatient world for the same complaint.  He had been given cefdinir and prednisone and took that for 3-1/2 days.  It caused diarrhea, so he stopped it.  He was seen again and given Zithromax from the ED.  He took that for a couple of days and is also not felt any better.  He really presents to the ED today for the same complaint.  He reports he still having the diarrhea.  It so bad that he sometimes incontinent.  It happens 2-5 times per day.  He has an associated burning and rawness from so much diarrhea.  He describes diarrhea as liquid and dark.  He did provide a sample in the ED.  He  reports he was vomiting for the first couple of days but he has not been since then.  Reports that eating would make him more nauseous, so his last normal meal was approximately 9 days ago.  Patient has not been taking any diabetes medications at home.   Patient is a current smoker he smokes 1-1/2 packs/day.  Since being sick he has been smoking about 2-3 cigarettes/day.  Patient encouraged to use that moment him to quit smoking.  He has not use illicit drugs.  Patient reports that he drinks 2-3 times a week but has never had DTs or withdrawals.  He is vaccinated for COVID.  He is full code.   In the ED Temp 98.5, heart rate 113-127, respiratory 20-26, blood pressure 144/93, satting 93% No leukocytosis with a white blood cell count of 8.3, hemoglobin 14.0 Chemistry panel is unremarkable Lactic acid initially 2.2 and then 1.8 Glucose was high at 305 so beta hydroxybutyrate was done which was 1.94 UA pending Urine culture pending Blood culture pending Chest x-ray shows stable extensive perihilar pulm infiltrate likely infectious.  Developing mild right-sided volume loss EKG shows a heart rate of 117, sinus tach, QTC 451 Admission requested for further work-up and treatment of pneumonia  Hospital Course by problem     Sepsis due to pneumonia  - sepsis physiology resolved with supportive measures - Pt is feeling much better now.    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  - fortamet 500 mg with titration instructions given      DVT prophylaxis: SQ heparin, SCDs Code Status: Full  Family Communication: significant other by phone 09/15/21  Disposition: anticipate DC home  Status is: Inpatient   Discharge Diagnoses:  Active Problems:   Pneumonia   Sepsis ([place])   Diabetes mellitus type 2 in nonobese [facility])   Protein calorie malnutrition ([place])   Vitamin D deficiency   Discharge Instructions: Discharge Instructions     Ambulatory referral to Endocrinology   Complete by: As directed       Allergies as of 09/16/2021   No Known Allergies      Medication List     STOP taking these medications    azithromycin 250 MG tablet Commonly known as: Zithromax Z-Pak       TAKE these medications    blood glucose meter kit and supplies Dispense based on patient and insurance preference. Use up to four times daily as directed. (FOR ICD-10 E10.9, E11.9).   guaiFENesin 600 MG 12 hr tablet Commonly known as: MUCINEX Take 2 tablets (1,200 mg total) by mouth 2 (two) times daily for 5 days.   levofloxacin 750 MG tablet Commonly known as: Levaquin Take 1 tablet (750 mg total) by mouth daily for 3 days.   metformin 500 MG (OSM) 24 hr tablet Commonly known as: FORTAMET Take 1 po daily with supper x 5 days, then 1 po BID with meals x 7 days, then 2 po BID with meals   nicotine 21 mg/24hr patch Commonly known as: NICODERM CQ - dosed in mg/24 hours Place 1 patch (21 mg total) onto the skin daily as needed for up to 14 days (nicotine craving).   ondansetron 4 MG tablet Commonly known as: Zofran Take 1 tablet (4 mg total) by mouth every 8 (eight) hours as needed for nausea or vomiting.   Vitamin D (Ergocalciferol) 1.25 MG (50000 UNIT) Caps capsule Commonly known as: DRISDOL Take 1 capsule (50,000 Units total) by mouth every 7 (seven) days. Start taking on: September 22, 2021        Follow-up Information     Primary care provider. Schedule  an appointment as soon as possible for a visit in 1 week(s).   Why: Hospital Follow Up               No Known Allergies Allergies as of 09/16/2021   No Known Allergies      Medication List     STOP taking these medications    azithromycin 250 MG tablet Commonly known as: Zithromax Z-Pak       TAKE these medications    blood glucose meter kit and supplies Dispense based on patient and insurance preference. Use up to four times daily as directed. (FOR ICD-10 E10.9, E11.9).   guaiFENesin 600 MG 12 hr tablet Commonly known as: MUCINEX Take 2 tablets (1,200 mg total) by mouth 2 (two) times daily  for 5 days.   levofloxacin 750 MG tablet Commonly known as: Levaquin Take 1 tablet (750 mg total) by mouth daily for 3 days.   metformin 500 MG (OSM) 24 hr tablet Commonly known as: FORTAMET Take 1 po daily with supper x 5 days, then 1 po BID with meals x 7 days, then 2 po BID with meals   nicotine 21 mg/24hr patch Commonly known as: NICODERM CQ - dosed in mg/24 hours Place 1 patch (21 mg total) onto the skin daily as needed for up to 14 days (nicotine craving).   ondansetron 4 MG tablet Commonly known as: Zofran Take 1 tablet (4 mg total) by mouth every 8 (eight) hours as needed for nausea or vomiting.   Vitamin D (Ergocalciferol) 1.25 MG (50000 UNIT) Caps capsule Commonly known as: DRISDOL Take 1 capsule (50,000 Units total) by mouth every 7 (seven) days. Start taking on: September 22, 2021        Procedures/Studies: [facility] Chest [place] 1 View  Result Date: 09/13/2021 CLINICAL DATA:  Sepsis EXAM: PORTABLE CHEST 1 VIEW COMPARISON:  09/11/2021 FINDINGS: Mild right-sided volume loss has developed. Extensive right perihilar pulmonary infiltrate is again seen, likely infectious in the acute setting. No pneumothorax or pleural effusion. Left lung is clear. Cardiac size within normal limits. Pulmonary vascularity is normal. No acute bone abnormality. IMPRESSION: Stable extensive  right perihilar pulmonary infiltrate, likely infectious in the acute setting. Developing mild right-sided volume loss. Electronically Signed   By: Fidela Salisbury M.D.   On: 09/13/2021 21:43   DG Chest Port 1 View  Result Date: 09/11/2021 CLINICAL DATA:  Fever, short of breath, sepsis EXAM: PORTABLE CHEST 1 VIEW COMPARISON:  None. FINDINGS: Single frontal view of the chest demonstrates an unremarkable cardiac silhouette. There is dense airspace disease within the right upper and right middle lobe consistent with bronchopneumonia. No effusion or pneumothorax. No acute bony abnormalities. IMPRESSION: 1. Right upper and right middle lobe bronchopneumonia. Electronically Signed   By: Randa Ngo M.D.   On: 09/11/2021 18:11     Subjective: Pt reports that he is breathing and feeling much better and wants to get home.  He says he will follow up with his PCP and take care of  his diabetes and try to stop smoking.   Discharge Exam: Vitals:   09/15/21 2153 09/16/21 0509  BP: 131/69 (!) 145/84  Pulse: 98 88  Resp: 18 17  Temp: (!) 97.5 F (36.4 C) (!) 97.3 F (36.3 C)  SpO2: 93% 95%   Vitals:   09/15/21 1403 09/15/21 2014 09/15/21 2153 09/16/21 0509  BP:   131/69 (!) 145/84  Pulse:   98 88  Resp:   18 17  Temp:   (!) 97.5 F (36.4 C) (!) 97.3 F (36.3 C)  TempSrc:    Oral  SpO2: 96% 97% 93% 95%  Weight:      Height:       General: Pt is alert, awake, not in acute distress Cardiovascular: normal S1/S2 +, no rubs, no gallops Respiratory: improved lung sounds bilateral, no wheezing, no rhonchi Abdominal: Soft, NT, ND, bowel sounds + Extremities: no edema, no cyanosis   The results of significant diagnostics from this hospitalization (including imaging, microbiology, ancillary and laboratory) are listed below for reference.     Microbiology: Recent Results (from the past 240 hour(s))  Blood Culture (routine x 2)     Status: None   Collection Time: 09/11/21  4:27 PM   Specimen: BLOOD  LEFT  ARM  Result Value Ref Range Status   Specimen Description BLOOD LEFT ARM  Final   Special Requests   Final    Immunocompromised BOTTLES DRAWN AEROBIC AND ANAEROBIC Blood Culture results may not be optimal due to an excessive volume of blood received in culture bottles   Culture   Final    NO GROWTH 5 DAYS Performed at [facility], [address] [place], [place] [address]    Report Status 09/16/2021 FINAL  Final  Resp Panel by RT-PCR (Flu A&B, Covid) Nasopharyngeal Swab     Status: None   Collection Time: 09/11/21  4:37 PM   Specimen: Nasopharyngeal Swab; Nasopharyngeal(NP) swabs in vial transport medium  Result Value Ref Range Status   SARS Coronavirus 2 by RT PCR NEGATIVE NEGATIVE Final    Comment: (NOTE) SARS-CoV-2 target nucleic acids are NOT DETECTED.  The SARS-CoV-2 RNA is generally detectable in upper respiratory specimens during the acute phase of infection. The lowest concentration of SARS-CoV-2 viral copies this assay can detect is 138 copies/mL. A negative result does not preclude SARS-Cov-2 infection and should not be used as the sole basis for treatment or other patient management decisions. A negative result may occur with  improper specimen collection/handling, submission of specimen other than nasopharyngeal swab, presence of viral mutation(s) within the areas targeted by this assay, and inadequate number of viral copies(<138 copies/mL). A negative result must be combined with clinical observations, patient history, and epidemiological information. The expected result is Negative.  Fact Sheet for Patients:  EntrepreneurPulse.com.au  Fact Sheet for Healthcare Providers:  IncredibleEmployment.be  This test is no t yet approved or cleared by the [place] FDA and  has been authorized for detection and/or diagnosis of SARS-CoV-2 by FDA under an Emergency Use Authorization (EUA). This EUA will remain  in effect (meaning  this test can be used) for the duration of the COVID-19 declaration under Section 564(b)(1) of the Act, 21 U.S.C.section 360bbb-3(b)(1), unless the authorization is terminated  or revoked sooner.       Influenza A by PCR NEGATIVE NEGATIVE Final   Influenza B by PCR NEGATIVE NEGATIVE Final    Comment: (NOTE) The Xpert Xpress SARS-CoV-2/FLU/RSV plus assay is intended as an aid in the diagnosis of influenza from Nasopharyngeal swab specimens and should not be used as a sole basis for treatment. Nasal washings and aspirates are unacceptable for Xpert Xpress SARS-CoV-2/FLU/RSV testing.  Fact Sheet for Patients: EntrepreneurPulse.com.au  Fact Sheet for Healthcare Providers: IncredibleEmployment.be  This test is not yet approved or cleared by the [place] FDA and has been authorized for detection and/or diagnosis of SARS-CoV-2 by FDA under an Emergency Use Authorization (EUA). This EUA will remain in effect (meaning this test can be used) for the duration of the COVID-19 declaration under Section 564(b)(1) of the Act, 21 U.S.C. section 360bbb-3(b)(1), unless the authorization is terminated or revoked.  Performed at [facility], [address] [place], [place] [address]   Blood Culture (routine x 2)     Status: None   Collection Time: 09/11/21  5:25 PM   Specimen: Right Antecubital; Blood  Result Value Ref Range Status   Specimen Description RIGHT ANTECUBITAL  Final   Special Requests   Final    Blood Culture results may not be optimal due to an excessive volume of blood received in culture bottles BOTTLES DRAWN AEROBIC AND ANAEROBIC   Culture   Final    NO GROWTH 5 DAYS Performed at [facility], [place]  [address], [place], [place] [address]    Report Status 09/16/2021 FINAL  Final  Blood Culture (routine x 2)     Status: None (Preliminary result)   Collection Time: 09/13/21  8:35 PM   Specimen: BLOOD RIGHT FOREARM  Result Value Ref Range  Status   Specimen Description BLOOD RIGHT FOREARM  Final   Special Requests   Final    Blood Culture results may not be optimal due to an inadequate volume of blood received in culture bottles BOTTLES DRAWN AEROBIC AND ANAEROBIC   Culture   Final    NO GROWTH 3 DAYS Performed at [facility], [address], [place], [place] [address]    Report Status PENDING  Incomplete  Blood Culture (routine x 2)     Status: None (Preliminary result)   Collection Time: 09/13/21  8:35 PM   Specimen: BLOOD RIGHT ARM  Result Value Ref Range Status   Specimen Description BLOOD RIGHT ARM  Final   Special Requests   Final    Blood Culture adequate volume BOTTLES DRAWN AEROBIC AND ANAEROBIC   Culture   Final    NO GROWTH 3 DAYS Performed at [facility], [address], [place], [place] [address]    Report Status PENDING  Incomplete  Urine Culture     Status: None   Collection Time: 09/13/21 10:06 PM   Specimen: In/Out Cath Urine  Result Value Ref Range Status   Specimen Description   Final    IN/OUT CATH URINE Performed at [facility], [address], [place], [place] [address]    Special Requests   Final    NONE Performed at [facility], [address] [place], [place] [address]    Culture   Final    NO GROWTH Performed at [facility], [place] [address], [place], [place] [address]    Report Status 09/15/2021 FINAL  Final  C Difficile Quick Screen w PCR reflex     Status: Abnormal   Collection Time: 09/13/21 10:13 PM   Specimen: STOOL  Result Value Ref Range Status   C Diff antigen POSITIVE (A) NEGATIVE Final   C Diff toxin NEGATIVE NEGATIVE Final   C Diff interpretation Results are indeterminate. See PCR results.  Final    Comment: Performed at [facility], [address], [place], [place] [address]  C. Diff by PCR, Reflexed     Status: None   Collection Time: 09/13/21 10:13 PM  Result Value Ref Range Status   Toxigenic C. Difficile by PCR NEGATIVE NEGATIVE Final    Comment: Patient  is colonized with non toxigenic C. difficile. May not need treatment unless significant symptoms are present. Performed at [facility], [place] [address], [place],  [address]   Resp Panel by RT-PCR (Flu A&B, Covid) Nasopharyngeal Swab     Status: None   Collection Time: 09/13/21 11:26 PM   Specimen: Nasopharyngeal Swab; Nasopharyngeal(NP) swabs in vial transport medium  Result Value Ref Range Status   SARS Coronavirus 2 by RT PCR NEGATIVE NEGATIVE Final    Comment: (NOTE) SARS-CoV-2 target nucleic acids are NOT DETECTED.  The SARS-CoV-2 RNA is generally detectable in upper respiratory specimens during the acute phase of infection. The lowest concentration of SARS-CoV-2 viral copies this assay can detect is 138 copies/mL. A negative result does not preclude SARS-Cov-2 infection and should not be used as the sole basis for treatment or other patient management decisions. A negative result may occur with  improper specimen collection/handling,  submission of specimen other than nasopharyngeal swab, presence of viral mutation(s) within the areas targeted by this assay, and inadequate number of viral copies(<138 copies/mL). A negative result must be combined with clinical observations, patient history, and epidemiological information. The expected result is Negative.  Fact Sheet for Patients:  EntrepreneurPulse.com.au  Fact Sheet for Healthcare Providers:  IncredibleEmployment.be  This test is no t yet approved or cleared by the [place] FDA and  has been authorized for detection and/or diagnosis of SARS-CoV-2 by FDA under an Emergency Use Authorization (EUA). This EUA will remain  in effect (meaning this test can be used) for the duration of the COVID-19 declaration under Section 564(b)(1) of the Act, 21 U.S.C.section 360bbb-3(b)(1), unless the authorization is terminated  or revoked sooner.       Influenza A by PCR NEGATIVE  NEGATIVE Final   Influenza B by PCR NEGATIVE NEGATIVE Final    Comment: (NOTE) The Xpert Xpress SARS-CoV-2/FLU/RSV plus assay is intended as an aid in the diagnosis of influenza from Nasopharyngeal swab specimens and should not be used as a sole basis for treatment. Nasal washings and aspirates are unacceptable for Xpert Xpress SARS-CoV-2/FLU/RSV testing.  Fact Sheet for Patients: EntrepreneurPulse.com.au  Fact Sheet for Healthcare Providers: IncredibleEmployment.be  This test is not yet approved or cleared by the [place] FDA and has been authorized for detection and/or diagnosis of SARS-CoV-2 by FDA under an Emergency Use Authorization (EUA). This EUA will remain in effect (meaning this test can be used) for the duration of the COVID-19 declaration under Section 564(b)(1) of the Act, 21 U.S.C. section 360bbb-3(b)(1), unless the authorization is terminated or revoked.  Performed at [facility], [address], [place], [place] [address]   MRSA Next Gen by PCR, Nasal     Status: None   Collection Time: 09/14/21 11:54 AM   Specimen: Nasal Mucosa; Nasal Swab  Result Value Ref Range Status   MRSA by PCR Next Gen NOT DETECTED NOT DETECTED Final    Comment: (NOTE) The GeneXpert MRSA Assay (FDA approved for NASAL specimens only), is one component of a comprehensive MRSA colonization surveillance program. It is not intended to diagnose MRSA infection nor to guide or monitor treatment for MRSA infections. Test performance is not FDA approved in patients less than 77 years old. Performed at [facility], [address] [place], [place] [address]      Labs: BNP (last 3 results) No results for input(s): BNP in the last 8760 hours. Basic Metabolic Panel: Recent Labs  Lab 09/11/21 1830 09/13/21 2035 09/14/21 0546 09/15/21 0530 09/16/21 0457  NA 129* 126* 132* 139 136  K 4.2 3.7 4.1 4.2 3.1*  CL 96* 92* 96* 105 101  CO2 19* _0 GLUCOSE 474* 305* 232* 97 191*  BUN 33* 24* 21 32* 10  CREATININE 1.08 0.93 0.71 1.72* 0.63  CALCIUM 7.7* 7.9* 8.0* 9.7 8.0*  MG 2.3  --  1.8  --  1.6*   Liver Function Tests: Recent Labs  Lab 09/11/21 1630 09/13/21 2035 09/14/21 0546 09/15/21 0530 09/16/21 0457  AST 38 112* 61* 18 51*  ALT 37 72* 51* 17 51*  ALKPHOS 83 93 74 74 72  BILITOT 1.0 1.0 0.8 0.8 0.7  PROT 6.5 7.0 5.5* 6.5 5.3*  ALBUMIN 2.5* 2.7* 2.1* 3.6 2.0*   No results for input(s): LIPASE, AMYLASE in the last 168 hours. No results for input(s): AMMONIA in the last 168 hours. CBC: Recent Labs  Lab  09/11/21 1630 09/13/21 2035 09/14/21 0546 09/15/21 0530  WBC 8.0 8.3 7.1 6.0  NEUTROABS 6.8 6.4  --  3.5  HGB 13.9 14.0 12.3* 15.0  HCT 39.5 41.8 35.8* 45.6  MCV 100.0 103.2* 102.3* 94.4  PLT 137* 205 209 150   Cardiac Enzymes: No results for input(s): CKTOTAL, CKMB, CKMBINDEX, TROPONINI in the last 168 hours. BNP: Invalid input(s): POCBNP CBG: Recent Labs  Lab 09/15/21 0836 09/15/21 1224 09/15/21 1724 09/16/21 0744 09/16/21 1115  GLUCAP 197* 262* 174* 209* 232*   D-Dimer No results for input(s): DDIMER in the last 72 hours. Hgb A1c Recent Labs    09/14/21 0546  HGBA1C 10.3*   Lipid Profile No results for input(s): CHOL, HDL, LDLCALC, TRIG, CHOLHDL, LDLDIRECT in the last 72 hours. Thyroid function studies Recent Labs    09/15/21 0530  TSH 1.550   Anemia work up No results for input(s): VITAMINB12, FOLATE, FERRITIN, TIBC, IRON, RETICCTPCT in the last 72 hours. Urinalysis    Component Value Date/Time   COLORURINE YELLOW 09/13/2021 2206   APPEARANCEUR CLEAR 09/13/2021 2206   LABSPEC 1.023 09/13/2021 2206   PHURINE 6.0 09/13/2021 2206   GLUCOSEU >=500 (A) 09/13/2021 2206   HGBUR SMALL (A) 09/13/2021 2206   BILIRUBINUR NEGATIVE 09/13/2021 2206   KETONESUR 20 (A) 09/13/2021 2206   PROTEINUR 30 (A) 09/13/2021 2206   NITRITE NEGATIVE 09/13/2021 2206   LEUKOCYTESUR NEGATIVE 09/13/2021  2206   Sepsis Labs Invalid input(s): PROCALCITONIN,  WBC,  LACTICIDVEN Microbiology Recent Results (from the past 240 hour(s))  Blood Culture (routine x 2)     Status: None   Collection Time: 09/11/21  4:27 PM   Specimen: BLOOD LEFT ARM  Result Value Ref Range Status   Specimen Description BLOOD LEFT ARM  Final   Special Requests   Final    Immunocompromised BOTTLES DRAWN AEROBIC AND ANAEROBIC Blood Culture results may not be optimal due to an excessive volume of blood received in culture bottles   Culture   Final    NO GROWTH 5 DAYS Performed at [facility], [address] [place], [place] [address]    Report Status 09/16/2021 FINAL  Final  Resp Panel by RT-PCR (Flu A&B, Covid) Nasopharyngeal Swab     Status: None   Collection Time: 09/11/21  4:37 PM   Specimen: Nasopharyngeal Swab; Nasopharyngeal(NP) swabs in vial transport medium  Result Value Ref Range Status   SARS Coronavirus 2 by RT PCR NEGATIVE NEGATIVE Final    Comment: (NOTE) SARS-CoV-2 target nucleic acids are NOT DETECTED.  The SARS-CoV-2 RNA is generally detectable in upper respiratory specimens during the acute phase of infection. The lowest concentration of SARS-CoV-2 viral copies this assay can detect is 138 copies/mL. A negative result does not preclude SARS-Cov-2 infection and should not be used as the sole basis for treatment or other patient management decisions. A negative result may occur with  improper specimen collection/handling, submission of specimen other than nasopharyngeal swab, presence of viral mutation(s) within the areas targeted by this assay, and inadequate number of viral copies(<138 copies/mL). A negative result must be combined with clinical observations, patient history, and epidemiological information. The expected result is Negative.  Fact Sheet for Patients:  EntrepreneurPulse.com.au  Fact Sheet for Healthcare Providers:   IncredibleEmployment.be  This test is no t yet approved or cleared by the [place] FDA and  has been authorized for detection and/or diagnosis of SARS-CoV-2 by FDA under an Emergency Use Authorization (EUA). This EUA will remain  in  effect (meaning this test can be used) for the duration of the COVID-19 declaration under Section 564(b)(1) of the Act, 21 U.S.C.section 360bbb-3(b)(1), unless the authorization is terminated  or revoked sooner.       Influenza A by PCR NEGATIVE NEGATIVE Final   Influenza B by PCR NEGATIVE NEGATIVE Final    Comment: (NOTE) The Xpert Xpress SARS-CoV-2/FLU/RSV plus assay is intended as an aid in the diagnosis of influenza from Nasopharyngeal swab specimens and should not be used as a sole basis for treatment. Nasal washings and aspirates are unacceptable for Xpert Xpress SARS-CoV-2/FLU/RSV testing.  Fact Sheet for Patients: EntrepreneurPulse.com.au  Fact Sheet for Healthcare Providers: IncredibleEmployment.be  This test is not yet approved or cleared by the [place] FDA and has been authorized for detection and/or diagnosis of SARS-CoV-2 by FDA under an Emergency Use Authorization (EUA). This EUA will remain in effect (meaning this test can be used) for the duration of the COVID-19 declaration under Section 564(b)(1) of the Act, 21 U.S.C. section 360bbb-3(b)(1), unless the authorization is terminated or revoked.  Performed at [facility], [address], [place], [place] [address]   Blood Culture (routine x 2)     Status: None   Collection Time: 09/11/21  5:25 PM   Specimen: Right Antecubital; Blood  Result Value Ref Range Status   Specimen Description RIGHT ANTECUBITAL  Final   Special Requests   Final    Blood Culture results may not be optimal due to an excessive volume of blood received in culture bottles BOTTLES DRAWN AEROBIC AND ANAEROBIC   Culture   Final    NO GROWTH 5  DAYS Performed at [facility], [address], [place], [place] [address]    Report Status 09/16/2021 FINAL  Final  Blood Culture (routine x 2)     Status: None (Preliminary result)   Collection Time: 09/13/21  8:35 PM   Specimen: BLOOD RIGHT FOREARM  Result Value Ref Range Status   Specimen Description BLOOD RIGHT FOREARM  Final   Special Requests   Final    Blood Culture results may not be optimal due to an inadequate volume of blood received in culture bottles BOTTLES DRAWN AEROBIC AND ANAEROBIC   Culture   Final    NO GROWTH 3 DAYS Performed at [facility], [address] [place], [place] [address]    Report Status PENDING  Incomplete  Blood Culture (routine x 2)     Status: None (Preliminary result)   Collection Time: 09/13/21  8:35 PM   Specimen: BLOOD RIGHT ARM  Result Value Ref Range Status   Specimen Description BLOOD RIGHT ARM  Final   Special Requests   Final    Blood Culture adequate volume BOTTLES DRAWN AEROBIC AND ANAEROBIC   Culture   Final    NO GROWTH 3 DAYS Performed at [facility], [address], [place], [place] [address]    Report Status PENDING  Incomplete  Urine Culture     Status: None   Collection Time: 09/13/21 10:06 PM   Specimen: In/Out Cath Urine  Result Value Ref Range Status   Specimen Description   Final    IN/OUT CATH URINE Performed at [facility], [address], [place], [place] [address]    Special Requests   Final    NONE Performed at [facility], [address], [place], [place] [address]    Culture   Final    NO GROWTH Performed at [facility], [place]  [address], [place], [place] [address]    Report Status 09/15/2021 FINAL  Final  C Difficile Quick Screen w PCR reflex     Status: Abnormal   Collection Time: 09/13/21 10:13 PM   Specimen: STOOL  Result Value Ref Range Status   C Diff antigen POSITIVE (A) NEGATIVE Final   C Diff toxin NEGATIVE NEGATIVE Final   C Diff interpretation Results are indeterminate. See PCR  results.  Final    Comment: Performed at [facility], [address], [place], [place] [address]  C. Diff by PCR, Reflexed     Status: None   Collection Time: 09/13/21 10:13 PM  Result Value Ref Range Status   Toxigenic C. Difficile by PCR NEGATIVE NEGATIVE Final    Comment: Patient is colonized with non toxigenic C. difficile. May not need treatment unless significant symptoms are present. Performed at [facility], [place] [address], [place], [place] [address]   Resp Panel by RT-PCR (Flu A&B, Covid) Nasopharyngeal Swab     Status: None   Collection Time: 09/13/21 11:26 PM   Specimen: Nasopharyngeal Swab; Nasopharyngeal(NP) swabs in vial transport medium  Result Value Ref Range Status   SARS Coronavirus 2 by RT PCR NEGATIVE NEGATIVE Final    Comment: (NOTE) SARS-CoV-2 target nucleic acids are NOT DETECTED.  The SARS-CoV-2 RNA is generally detectable in upper respiratory specimens during the acute phase of infection. The lowest concentration of SARS-CoV-2 viral copies this assay can detect is 138 copies/mL. A negative result does not preclude SARS-Cov-2 infection and should not be used as the sole basis for treatment or other patient management decisions. A negative result may occur with  improper specimen collection/handling, submission of specimen other than nasopharyngeal swab, presence of viral mutation(s) within the areas targeted by this assay, and inadequate number of viral copies(<138 copies/mL). A negative result must be combined with clinical observations, patient history, and epidemiological information. The expected result is Negative.  Fact Sheet for Patients:  EntrepreneurPulse.com.au  Fact Sheet for Healthcare Providers:  IncredibleEmployment.be  This test is no t yet approved or cleared by the [place] FDA and  has been authorized for detection and/or diagnosis of SARS-CoV-2 by FDA under an Emergency Use Authorization  (EUA). This EUA will remain  in effect (meaning this test can be used) for the duration of the COVID-19 declaration under Section 564(b)(1) of the Act, 21 U.S.C.section 360bbb-3(b)(1), unless the authorization is terminated  or revoked sooner.       Influenza A by PCR NEGATIVE NEGATIVE Final   Influenza B by PCR NEGATIVE NEGATIVE Final    Comment: (NOTE) The Xpert Xpress SARS-CoV-2/FLU/RSV plus assay is intended as an aid in the diagnosis of influenza from Nasopharyngeal swab specimens and should not be used as a sole basis for treatment. Nasal washings and aspirates are unacceptable for Xpert Xpress SARS-CoV-2/FLU/RSV testing.  Fact Sheet for Patients: EntrepreneurPulse.com.au  Fact Sheet for Healthcare Providers: IncredibleEmployment.be  This test is not yet approved or cleared by the [place] FDA and has been authorized for detection and/or diagnosis of SARS-CoV-2 by FDA under an Emergency Use Authorization (EUA). This EUA will remain in effect (meaning this test can be used) for the duration of the COVID-19 declaration under Section 564(b)(1) of the Act, 21 U.S.C. section 360bbb-3(b)(1), unless the authorization is terminated or revoked.  Performed at [facility], [address] [place],  [address]   MRSA Next Gen by PCR, Nasal     Status: None   Collection Time:  09/14/21 11:54 AM   Specimen: Nasal Mucosa; Nasal Swab  Result Value Ref Range Status   MRSA by PCR Next Gen NOT DETECTED NOT DETECTED Final    Comment: (NOTE) The GeneXpert MRSA Assay (FDA approved for NASAL specimens only), is one component of a comprehensive MRSA colonization surveillance program. It is not intended to diagnose MRSA infection nor to guide or monitor treatment for MRSA infections. Test performance is not FDA approved in patients less than 65 years old. Performed at [facility], [address], [place], [place] [address]     Time  coordinating discharge: 38 mins  SIGNED:  Irwin Brakeman, MD  [facility] 09/16/2021, 11:31 AM How to contact the [facility] Attending or Consulting provider [place] or covering provider during after hours [place], for this patient?  Check the care team in [facility] and look for a) attending/consulting TRH provider listed and b) the [facility] team listed Log into [email or web address] and use [place]'s universal password to access. If you do not have the password, please contact the hospital operator. Locate the [facility] provider you are looking for under [facility] and page to a number that you can be directly reached. If you still have difficulty reaching the provider, please page the [facility] (Director on Call) for the Hospitalists listed on amion for assistance.

## 2021-09-16 NOTE — Progress Notes (Signed)
Discharge instructions provided to patient. Educated on regimen for taking metformin, and how and when to check sugar. Instructed to follow-up with endocrinology for further management of diabetes. Patient verbalized understanding and had no questions. Awaiting wife to arrive.

## 2021-09-16 NOTE — TOC Transition Note (Signed)
Transition of Care [facility]) - CM/SW Discharge Note   Patient Details  Name: Howard Cameron MRN: [id number] Date of Birth: [birth year]  Transition of Care [facility]) CM/SW Contact:  Villa Herb, LCSWA Phone Number: 09/16/2021, 12:30 PM   Clinical Narrative:    CSW updated that pt is in need of a rolling walker. CSW spoke with PT who feels pt does not need HH. CSW spoke to [place] with [employer] who accepts the referral. CSW asked that MD place orders for DME. TOC signing off.  Final next level of care: Home/Self Care Barriers to Discharge: No Barriers Identified   Patient Goals and CMS Choice Patient states their goals for this hospitalization and ongoing recovery are:: Return home CMS Medicare.gov Compare Post Acute Care list provided to:: Patient Choice offered to / list presented to : Patient  Discharge Placement                       Discharge Plan and Services                DME Arranged: Dan Humphreys DME Agency: [facility] Date DME Agency Contacted: 09/16/21   Representative spoke with at DME Agency: Leavy Cella            Social Determinants of Health (SDOH) Interventions     Readmission Risk Interventions No flowsheet data found.

## 2021-09-16 NOTE — Discharge Instructions (Signed)

## 2021-09-18 LAB — CULTURE, BLOOD (ROUTINE X 2)
Culture: NO GROWTH
Culture: NO GROWTH
Special Requests: ADEQUATE

## 2021-10-08 ENCOUNTER — Ambulatory Visit (INDEPENDENT_AMBULATORY_CARE_PROVIDER_SITE_OTHER): Payer: 59 | Admitting: "Endocrinology

## 2021-10-08 ENCOUNTER — Ambulatory Visit (HOSPITAL_COMMUNITY)
Admission: RE | Admit: 2021-10-08 | Discharge: 2021-10-08 | Disposition: A | Discharge status: Home / Self Care | Payer: 59 | Source: Ambulatory Visit | Attending: Adult Health | Admitting: Adult Health

## 2021-10-08 ENCOUNTER — Other Ambulatory Visit (HOSPITAL_COMMUNITY): Payer: Self-pay | Admitting: Adult Health

## 2021-10-08 ENCOUNTER — Other Ambulatory Visit: Payer: Self-pay

## 2021-10-08 ENCOUNTER — Encounter: Payer: Self-pay | Admitting: "Endocrinology

## 2021-10-08 VITALS — BP 108/72 | HR 116 | Ht 70.0 in | Wt 174.6 lb

## 2021-10-08 DIAGNOSIS — I10 Essential (primary) hypertension: Secondary | ICD-10-CM

## 2021-10-08 DIAGNOSIS — E785 Hyperlipidemia, unspecified: Secondary | ICD-10-CM

## 2021-10-08 DIAGNOSIS — E559 Vitamin D deficiency, unspecified: Secondary | ICD-10-CM | POA: Diagnosis not present

## 2021-10-08 DIAGNOSIS — E1165 Type 2 diabetes mellitus with hyperglycemia: Secondary | ICD-10-CM | POA: Diagnosis not present

## 2021-10-08 DIAGNOSIS — J189 Pneumonia, unspecified organism: Secondary | ICD-10-CM

## 2021-10-08 DIAGNOSIS — Z7189 Other specified counseling: Secondary | ICD-10-CM

## 2021-10-08 DIAGNOSIS — F17200 Nicotine dependence, unspecified, uncomplicated: Secondary | ICD-10-CM | POA: Diagnosis not present

## 2021-10-08 DIAGNOSIS — R0600 Dyspnea, unspecified: Secondary | ICD-10-CM | POA: Diagnosis present

## 2021-10-08 MED ORDER — METFORMIN HCL ER (OSM) 500 MG PO TB24: 500.0000 mg | ORAL_TABLET | Freq: Two times a day (BID) | ORAL | 1 refills | Status: DC

## 2021-10-08 NOTE — Patient Instructions (Signed)

## 2021-10-08 NOTE — Progress Notes (Signed)
Endocrinology Consult Note       10/08/2021, 1:12 PM   Subjective:    Patient ID: Howard Cameron, male    DOB: December 04, 1961.  Howard Cameron is being seen in consultation for management of currently uncontrolled symptomatic diabetes requested by  Pcp, No.   Past Medical History:  Diagnosis Date   Diabetes mellitus without complication ([place])    Diabetes mellitus, type II ([place])    Pneumonia     Past Surgical History:  Procedure Laterality Date   BACK SURGERY      Social History   Socioeconomic History   Marital status: Single    Spouse name: Not on file   Number of children: Not on file   Years of education: Not on file   Highest education level: Not on file  Occupational History   Not on file  Tobacco Use   Smoking status: Every Day    Packs/day: 1.50    Types: Cigarettes   Smokeless tobacco: Never  Vaping Use   Vaping Use: Never used  Substance and Sexual Activity   Alcohol use: Yes    Comment: couple of drinks a night   Drug use: Not Currently   Sexual activity: Not on file  Other Topics Concern   Not on file  Social History Narrative   Not on file   Social Determinants of Health   Financial Resource Strain: Not on file  Food Insecurity: Not on file  Transportation Needs: Not on file  Physical Activity: Not on file  Stress: Not on file  Social Connections: Not on file    History reviewed. No pertinent family history.  Outpatient Encounter Medications as of 10/08/2021  Medication Sig   CINNAMON PO Take 1 tablet by mouth daily in the afternoon.   MELATONIN GUMMIES PO Take 2 tablets by mouth at bedtime.   Omega-3 Fatty Acids (FISH OIL OMEGA-3 PO) Take 1 capsule by mouth daily in the afternoon.   blood glucose meter kit and supplies Dispense based on patient and insurance preference. Use up to four times daily as directed. (FOR ICD-10 E10.9, E11.9).   metformin (FORTAMET) 500  MG (OSM) 24 hr tablet Take 1 tablet (500 mg total) by mouth 2 (two) times daily with a meal.   ondansetron (ZOFRAN) 4 MG tablet Take 1 tablet (4 mg total) by mouth every 8 (eight) hours as needed for nausea or vomiting.   Vitamin D, Ergocalciferol, (DRISDOL) 1.25 MG (50000 UNIT) CAPS capsule Take 1 capsule (50,000 Units total) by mouth every 7 (seven) days.   [DISCONTINUED] metformin (FORTAMET) 500 MG (OSM) 24 hr tablet Take 1 po daily with supper x 5 days, then 1 po BID with meals x 7 days, then 2 po BID with meals   No facility-administered encounter medications on file as of 10/08/2021.    ALLERGIES: No Known Allergies  VACCINATION STATUS: Immunization History  Administered Date(s) Administered   Influenza,inj,Quad PF,6+ Mos 09/15/2021    Diabetes He presents for his initial diabetic visit. He has type 2 diabetes mellitus. Onset time: He was told to have prediabetes since at least 2014, was not diagnosed  with diabetes until his recent hospitalization a month ago. His disease course has been worsening. There are no hypoglycemic associated symptoms. Pertinent negatives for hypoglycemia include no confusion, headaches, pallor or seizures. Associated symptoms include polydipsia and polyuria. Pertinent negatives for diabetes include no chest pain, no fatigue, no polyphagia and no weakness. There are no hypoglycemic complications. Symptoms are worsening. Diabetic complications include peripheral neuropathy. Risk factors for coronary artery disease include male sex, sedentary lifestyle and tobacco exposure. Current diabetic treatments: He takes 2000 mg of metformin ER daily. His weight is fluctuating minimally (Historically, patient weighed more than 200 pounds.  He gives history of heavy alcohol use, still drinks.  He did have unintended weight loss over the years.). He is following a generally unhealthy diet. When asked about meal planning, he reported none. He has not had a previous visit with a  dietitian. He rarely participates in exercise. His home blood glucose trend is fluctuating minimally. His overall blood glucose range is >200 mg/dl. (Patient brought and had showing persistent hypoglycemia above 200 mg per DL.  His recent A1c was 10.3%.  He did not document any hypoglycemia.) An ACE inhibitor/angiotensin II receptor blocker is not being taken.    Review of Systems  Constitutional:  Negative for chills, fatigue, fever and unexpected weight change.  HENT:  Negative for dental problem, mouth sores and trouble swallowing.   Eyes:  Negative for visual disturbance.  Respiratory:  Negative for cough, choking, chest tightness, shortness of breath and wheezing.   Cardiovascular:  Negative for chest pain, palpitations and leg swelling.  Gastrointestinal:  Negative for abdominal distention, abdominal pain, constipation, diarrhea, nausea and vomiting.  Endocrine: Positive for polydipsia and polyuria. Negative for polyphagia.  Genitourinary:  Negative for dysuria, flank pain, hematuria and urgency.  Musculoskeletal:  Negative for back pain, gait problem, myalgias and neck pain.  Skin:  Negative for pallor, rash and wound.  Neurological:  Negative for seizures, syncope, weakness, numbness and headaches.  Psychiatric/Behavioral:  Negative for confusion and dysphoric mood.    Objective:    Vitals with BMI 10/08/2021 09/16/2021 09/15/2021  Height 5' 10" - -  Weight 174 lbs 10 oz - -  BMI 09.38 - -  Systolic 182 993 716  Diastolic 72 84 69  Pulse 967 88 98    BP 108/72   Pulse (!) 116   Ht 5' 10" (1.778 m)   Wt 174 lb 9.6 oz (79.2 kg)   BMI 25.05 kg/m   Wt Readings from Last 3 Encounters:  10/08/21 174 lb 9.6 oz (79.2 kg)  09/14/21 181 lb 3.5 oz (82.2 kg)  09/11/21 175 lb (79.4 kg)     Physical Exam Constitutional:      General: He is not in acute distress.    Appearance: He is well-developed.  HENT:     Head: Normocephalic and atraumatic.  Neck:     Thyroid: No  thyromegaly.     Trachea: No tracheal deviation.  Cardiovascular:     Rate and Rhythm: Normal rate.     Pulses:          Dorsalis pedis pulses are 1+ on the right side and 1+ on the left side.       Posterior tibial pulses are 1+ on the right side and 1+ on the left side.     Heart sounds: Normal heart sounds, S1 normal and S2 normal. No murmur heard.   No gallop.  Pulmonary:     Effort: No respiratory distress.  Breath sounds: Normal breath sounds. No wheezing.  Abdominal:     General: Bowel sounds are normal. There is no distension.     Palpations: Abdomen is soft.     Tenderness: There is no abdominal tenderness. There is no guarding.  Musculoskeletal:     Right shoulder: No swelling or deformity.     Cervical back: Normal range of motion and neck supple.  Skin:    General: Skin is warm and dry.     Findings: No rash.     Nails: There is no clubbing.  Neurological:     Mental Status: He is alert and oriented to person, place, and time.     Cranial Nerves: No cranial nerve deficit.     Sensory: No sensory deficit.     Gait: Gait normal.     Deep Tendon Reflexes: Reflexes are normal and symmetric.     Comments: Diminished monofilament test sensation on bilateral lower extremities, diminished dorsalis pedis pulse on bilateral feet.  Psychiatric:        Speech: Speech normal.        Behavior: Behavior normal. Behavior is cooperative.        Thought Content: Thought content normal.        Judgment: Judgment normal.      CMP ( most recent) CMP     Component Value Date/Time   NA 136 09/16/2021 0457   K 3.1 (L) 09/16/2021 0457   CL 101 09/16/2021 0457   CO2 28 09/16/2021 0457   GLUCOSE 191 (H) 09/16/2021 0457   BUN 10 09/16/2021 0457   CREATININE 0.63 09/16/2021 0457   CALCIUM 8.0 (L) 09/16/2021 0457   PROT 5.3 (L) 09/16/2021 0457   ALBUMIN 2.0 (L) 09/16/2021 0457   AST 51 (H) 09/16/2021 0457   ALT 51 (H) 09/16/2021 0457   ALKPHOS 72 09/16/2021 0457   BILITOT 0.7  09/16/2021 0457   GFRNONAA >60 09/16/2021 0457     Diabetic Labs (most recent): Lab Results  Component Value Date   HGBA1C 10.3 (H) 09/14/2021     Lab Results  Component Value Date   TSH 1.550 09/15/2021      Assessment & Plan:   1. Poorly controlled type 2 diabetes mellitus ([place]) - Howard Cameron has currently uncontrolled symptomatic type 2 DM since  60 years of age (history of prediabetes for 8 years prior to diagnosis with type 2 diabetes) with most recent A1c of 10.3 %. Recent labs reviewed. - I had a long discussion with him about the progressive nature of diabetes and the pathology behind its complications. -his diabetes is complicated by neuropathy, significant alcohol history, continued heavy smoking and he remains at a high risk for more acute and chronic complications which include CAD, CVA, CKD, retinopathy, and neuropathy. These are all discussed in detail with him.  - I have counseled him on diet  and weight management  by adopting a carbohydrate restricted/protein rich diet. Patient is encouraged to switch to  unprocessed or minimally processed     complex starch and increased protein intake (animal or plant source), fruits, and vegetables. -  he is advised to stick to a routine mealtimes to eat 3 meals  a day and avoid unnecessary snacks ( to snack only to correct hypoglycemia).   - he acknowledges that there is a room for improvement in his food and drink choices. - Suggestion is made for him to avoid simple carbohydrates  from his diet including Cakes, Sweet Desserts,  Ice Cream, Soda (diet and regular), Sweet Tea, Candies, Chips, Cookies, Store Bought Juices, Alcohol in Excess of  1-2 drinks a day, Artificial Sweeteners,  Coffee Creamer, and "Sugar-free" Products. This will help patient to have more stable blood glucose profile and potentially avoid unintended weight gain.  - he will be scheduled with Jearld Fenton, RDN, CDE for diabetes education.  - I have  approached him with the following individualized plan to manage  his diabetes and patient agrees:   -Risk of pancreatic diabetes in this patient is high. -With intensive lifestyle change, he can delay the need for insulin treatment.  -He is approached for strict monitoring of glucose 4 times a day-before meals and at bedtime and return in 10 days with his meter and logs for evaluation.  - he is encouraged to call clinic for blood glucose levels less than 70 or above 200 mg /dl. - he is advised to lower his metformin to 500 mg ER p.o. twice daily , therapeutically suitable for patient . -He is not a suitable candidate for incretin therapy. -If he presents with significant hyperglycemia, he will be considered for basal insulin.  - he will be considered for incretin therapy as appropriate next visit.  - Specific targets for  A1c;  LDL, HDL,  and Triglycerides were discussed with the patient.  2) Blood Pressure /Hypertension:  his blood pressure is  controlled to target.   he is not on any antihypertensive medications.    3) Lipids/Hyperlipidemia: He does not have recent lipid panel to review.  He is advised to continue his omega-3 fatty acids 1 capsule twice daily, he will be considered for fasting lipid panel on subsequent visits.  He is to continue his vitamin D treatment with 50,000 units weekly.  4)  Weight/Diet:  Body mass index is 25.05 kg/m.  -    he is not a candidate for weight loss.  Exercise, and detailed carbohydrates information provided  -  detailed on discharge instructions.  5) Chronic Care/Health Maintenance:  -he  is not  on ACEI/ARB and Statin medications and  is encouraged to initiate and continue to follow up with Ophthalmology, Dentist,  Podiatrist at least yearly or according to recommendations, and advised to  quit smoking. I have recommended yearly flu vaccine and pneumonia vaccine at least every 5 years; moderate intensity exercise for up to 150 minutes weekly; and   sleep for at least 7 hours a day.   The patient was counseled on the dangers of tobacco use, and was advised to quit.  Reviewed strategies to maximize success, including removing cigarettes and smoking materials from environment. -He is also encouraged to consider weaning off of alcohol.   - he is  advised to maintain close follow up with Pcp, No for primary care needs, as well as his other providers for optimal and coordinated care.   I spent 85 minutes in the care of the patient today including review of labs from [place], Lipids, Thyroid Function, Hematology (current and previous including abstractions from other facilities); face-to-face time discussing  his blood glucose readings/logs, discussing hypoglycemia and hyperglycemia episodes and symptoms, medications doses, his options of short and long term treatment based on the latest standards of care / guidelines;  discussion about incorporating lifestyle medicine;  and documenting the encounter.     Please refer to Patient Instructions for Blood Glucose Monitoring and Insulin/Medications Dosing Guide"  in media tab for additional information. Please  also refer to " Patient Self Inventory"  in the Media  tab for reviewed elements of pertinent patient history.  Rayburn Felt participated in the discussions, expressed understanding, and voiced agreement with the above plans.  All questions were answered to his satisfaction. he is encouraged to contact clinic should he have any questions or concerns prior to his return visit.    Follow up plan: - Return in about 10 days (around 10/18/2021) for F/U with Meter and Logs Only - no Labs.  Glade Lloyd, MD [facility] [address] [place], [place] [address] Phone: [phone]  Fax: [phone]    10/08/2021, 1:12 PM  This note was partially dictated with voice recognition software. Similar sounding words can be transcribed inadequately or  may not  be corrected upon review.

## 2021-10-21 ENCOUNTER — Encounter (HOSPITAL_COMMUNITY): Payer: Self-pay | Admitting: Emergency Medicine

## 2021-10-21 ENCOUNTER — Emergency Department (HOSPITAL_COMMUNITY): Payer: 59

## 2021-10-21 ENCOUNTER — Other Ambulatory Visit: Payer: Self-pay

## 2021-10-21 ENCOUNTER — Emergency Department (HOSPITAL_COMMUNITY)
Admission: EM | Admit: 2021-10-21 | Discharge: 2021-10-22 | Disposition: A | Discharge status: Home / Self Care | Payer: 59 | Attending: Emergency Medicine | Admitting: Emergency Medicine

## 2021-10-21 DIAGNOSIS — L03116 Cellulitis of left lower limb: Secondary | ICD-10-CM | POA: Insufficient documentation

## 2021-10-21 DIAGNOSIS — E119 Type 2 diabetes mellitus without complications: Secondary | ICD-10-CM | POA: Insufficient documentation

## 2021-10-21 DIAGNOSIS — S99922A Unspecified injury of left foot, initial encounter: Secondary | ICD-10-CM | POA: Diagnosis present

## 2021-10-21 DIAGNOSIS — W228XXA Striking against or struck by other objects, initial encounter: Secondary | ICD-10-CM | POA: Diagnosis not present

## 2021-10-21 DIAGNOSIS — S90415A Abrasion, left lesser toe(s), initial encounter: Secondary | ICD-10-CM | POA: Insufficient documentation

## 2021-10-21 DIAGNOSIS — F17210 Nicotine dependence, cigarettes, uncomplicated: Secondary | ICD-10-CM | POA: Insufficient documentation

## 2021-10-21 DIAGNOSIS — Z7984 Long term (current) use of oral hypoglycemic drugs: Secondary | ICD-10-CM | POA: Diagnosis not present

## 2021-10-21 NOTE — ED Provider Notes (Signed)
[facility] EMERGENCY DEPARTMENT Provider Note  CSN: [id number] Arrival date & time: 10/21/21 2313    History Chief Complaint  Patient presents with   Toe Injury    Howard Cameron is a 60 y.o. male with history of DM, not well controlled with labile glucoses reports he stubbed his L 4th toe on the furniture last night. He has neuropathy and didn't noticed that he had broken the skin on that toe until he saw blood on his sock. He has also begun having redness and swelling to this foot. A blister has erupted on his L great toe as well. Denies any fever, nausea, vomiting, CP, SOB or other concerns.    Past Medical History:  Diagnosis Date   Diabetes mellitus without complication ([place])    Diabetes mellitus, type II ([place])    Pneumonia     Past Surgical History:  Procedure Laterality Date   BACK SURGERY      History reviewed. No pertinent family history.  Social History   Tobacco Use   Smoking status: Every Day    Packs/day: 1.50    Types: Cigarettes   Smokeless tobacco: Never  Vaping Use   Vaping Use: Never used  Substance Use Topics   Alcohol use: Yes    Comment: couple of drinks a night   Drug use: Not Currently     Home Medications Prior to Admission medications   Medication Sig Start Date End Date Taking? Authorizing Provider  doxycycline (VIBRAMYCIN) 100 MG capsule Take 1 capsule (100 mg total) by mouth 2 (two) times daily. 10/22/21  Yes Truddie Hidden, MD  blood glucose meter kit and supplies Dispense based on patient and insurance preference. Use up to four times daily as directed. (FOR ICD-10 E10.9, E11.9). 09/16/21   Johnson, Clanford L, MD  CINNAMON PO Take 1 tablet by mouth daily in the afternoon.    [provider]  MELATONIN GUMMIES PO Take 2 tablets by mouth at bedtime.    [provider]  metformin (FORTAMET) 500 MG (OSM) 24 hr tablet Take 1 tablet (500 mg total) by mouth 2 (two) times daily with a meal. 10/08/21   Nida,  Marella Chimes, MD  Omega-3 Fatty Acids (FISH OIL OMEGA-3 PO) Take 1 capsule by mouth daily in the afternoon.    [provider]  ondansetron (ZOFRAN) 4 MG tablet Take 1 tablet (4 mg total) by mouth every 8 (eight) hours as needed for nausea or vomiting. 09/11/21   Margarita Mail, PA-C  Vitamin D, Ergocalciferol, (DRISDOL) 1.25 MG (50000 UNIT) CAPS capsule Take 1 capsule (50,000 Units total) by mouth every 7 (seven) days. 09/22/21   Murlean Iba, MD     Allergies    Patient has no known allergies.   Review of Systems   Review of Systems A comprehensive review of systems was completed and negative except as noted in HPI.    Physical Exam BP 130/84 (BP Location: Left Arm)   Pulse (!) 106   Temp 97.8 F (36.6 C) (Oral)   Resp 17   Ht 5' 10"  (1.778 m)   Wt 79.4 kg   SpO2 96%   BMI 25.11 kg/m   Physical Exam Vitals and nursing note reviewed.  Constitutional:      Appearance: Normal appearance.  HENT:     Head: Normocephalic and atraumatic.     Nose: Nose normal.     Mouth/Throat:     Mouth: Mucous membranes are moist.  Eyes:  Extraocular Movements: Extraocular movements intact.     Conjunctiva/sclera: Conjunctivae normal.  Cardiovascular:     Rate and Rhythm: Normal rate.  Pulmonary:     Effort: Pulmonary effort is normal.     Breath sounds: Normal breath sounds.  Abdominal:     General: Abdomen is flat.     Palpations: Abdomen is soft.     Tenderness: There is no abdominal tenderness.  Musculoskeletal:        General: Signs of injury present. No swelling. Normal range of motion.     Cervical back: Neck supple.     Comments: Abrasion to the L 4th toe, with surrounding erythema and induration. There is also a fluid filled bulla to the tip of the L great toe. See photos below.   Skin:    General: Skin is warm and dry.  Neurological:     General: No focal deficit present.     Mental Status: He is alert.  Psychiatric:        Mood and Affect: Mood  normal.        ED Results / Procedures / Treatments   Labs (all labs ordered are listed, but only abnormal results are displayed) Labs Reviewed  BASIC METABOLIC PANEL - Abnormal; Notable for the following components:      Result Value   Sodium 132 (*)    Glucose, Bld 237 (*)    All other components within normal limits  CBC WITH DIFFERENTIAL/PLATELET - Abnormal; Notable for the following components:   RBC 3.65 (*)    Hemoglobin 12.7 (*)    HCT 35.4 (*)    MCH 34.8 (*)    All other components within normal limits  SEDIMENTATION RATE - Abnormal; Notable for the following components:   Sed Rate 37 (*)    All other components within normal limits    EKG None  Radiology DG Toe 4th Left  Result Date: 10/22/2021 CLINICAL DATA:  Trauma to the left fourth toe. EXAM: LEFT FOURTH TOE COMPARISON:  None. FINDINGS: There is no acute fracture or dislocation. There is no aspiration of the soft tissue of the fourth toe. No radiopaque foreign object or soft tissue gas. IMPRESSION: No acute fracture or dislocation. Electronically Signed   By: Anner Crete M.D.   On: 10/22/2021 00:14    Procedures Procedures  Medications Ordered in the ED Medications  doxycycline (VIBRA-TABS) tablet 100 mg (has no administration in time range)     MDM Rules/Calculators/A&P MDM Patient with DM and peripheral neuropathy here with toe injury now having redness and swelling. Will check labs and xray.   ED Course  I have reviewed the triage vital signs and the nursing notes.  Pertinent labs & imaging results that were available during my care of the patient were reviewed by me and considered in my medical decision making (see chart for details).  Clinical Course as of 10/22/21 0128  Mon Oct 22, 2021  0019 Xray is neg for fracture, CBC without leukocytosis.  [CS]  0031 BMP with mildly elevated glucose, but no signs of DKA.  [CS]  0126 Sed rate only mildly elevated. Will ask RN to clean and dress  toe wound. Rx for doxycyline and close PCP follow up for wound check.  [CS]    Clinical Course User Index [CS] Truddie Hidden, MD    Final Clinical Impression(s) / ED Diagnoses Final diagnoses:  Cellulitis of left foot  Abrasion of lesser toe of left foot, initial encounter  Rx / DC Orders ED Discharge Orders          Ordered    doxycycline (VIBRAMYCIN) 100 MG capsule  2 times daily        10/22/21 0127             Truddie Hidden, MD 10/22/21 [phone]

## 2021-10-21 NOTE — ED Triage Notes (Addendum)
Pt hit left 4th toe last night on the couch. Hx of DM, pt cannot feel his toes. This evening pt noticed blood in sock and skin wearing away. Big toe has a large blister that pt noticed this evening. Pt denies any fever. Left Foot has 2+ edema and warm to touch. Pedal pulses felt in left foot.

## 2021-10-22 ENCOUNTER — Other Ambulatory Visit: Payer: Self-pay

## 2021-10-22 ENCOUNTER — Ambulatory Visit: Payer: 59 | Admitting: "Endocrinology

## 2021-10-22 ENCOUNTER — Encounter: Payer: Self-pay | Admitting: "Endocrinology

## 2021-10-22 VITALS — BP 84/58 | HR 120 | Ht 70.0 in | Wt 175.8 lb

## 2021-10-22 DIAGNOSIS — F17200 Nicotine dependence, unspecified, uncomplicated: Secondary | ICD-10-CM | POA: Diagnosis not present

## 2021-10-22 DIAGNOSIS — E1165 Type 2 diabetes mellitus with hyperglycemia: Secondary | ICD-10-CM

## 2021-10-22 DIAGNOSIS — E559 Vitamin D deficiency, unspecified: Secondary | ICD-10-CM | POA: Diagnosis not present

## 2021-10-22 LAB — BASIC METABOLIC PANEL
Anion gap: 11 (ref 5–15)
BUN: 15 mg/dL (ref 8–23)
CO2: 23 mmol/L (ref 22–32)
Calcium: 8.9 mg/dL (ref 8.9–10.3)
Chloride: 98 mmol/L (ref 98–111)
Creatinine, Ser: 0.74 mg/dL (ref 0.61–1.24)
GFR, Estimated: >60 mL/min (ref >60)
Glucose, Bld: 237 mg/dL — ABNORMAL HIGH (ref 70–99)
Potassium: 3.9 mmol/L (ref 3.5–5.1)
Sodium: 132 mmol/L — ABNORMAL LOW (ref 135–145)

## 2021-10-22 LAB — CBC WITH DIFFERENTIAL/PLATELET
Abs Immature Granulocytes: 0.02 10*3/uL (ref 0.00–0.07)
Basophils Absolute: 0 10*3/uL (ref 0.0–0.1)
Basophils Relative: 1 %
Eosinophils Absolute: 0.1 10*3/uL (ref 0.0–0.5)
Eosinophils Relative: 2 %
HCT: 35.4 % — ABNORMAL LOW (ref 39.0–52.0)
Hemoglobin: 12.7 g/dL — ABNORMAL LOW (ref 13.0–17.0)
Immature Granulocytes: 0 %
Lymphocytes Relative: 27 %
Lymphs Abs: 2.3 10*3/uL (ref 0.7–4.0)
MCH: 34.8 pg — ABNORMAL HIGH (ref 26.0–34.0)
MCHC: 35.9 g/dL (ref 30.0–36.0)
MCV: 97 fL (ref 80.0–100.0)
Monocytes Absolute: 0.8 10*3/uL (ref 0.1–1.0)
Monocytes Relative: 10 %
Neutro Abs: 5.2 10*3/uL (ref 1.7–7.7)
Neutrophils Relative %: 60 %
Platelets: 174 10*3/uL (ref 150–400)
RBC: 3.65 MIL/uL — ABNORMAL LOW (ref 4.22–5.81)
RDW: 12.1 % (ref 11.5–15.5)
WBC: 8.5 10*3/uL (ref 4.0–10.5)
nRBC: 0 % (ref 0.0–0.2)

## 2021-10-22 LAB — SEDIMENTATION RATE: Sed Rate: 37 mm/hr — ABNORMAL HIGH (ref 0–16)

## 2021-10-22 MED ORDER — NOVOLOG MIX 70/30 FLEXPEN (70-30) 100 UNIT/ML ~~LOC~~ SUPN: 20.0000 [IU] | PEN_INJECTOR | Freq: Two times a day (BID) | SUBCUTANEOUS | 11 refills | Status: DC

## 2021-10-22 MED ORDER — BD PEN NEEDLE NANO U/F 32G X 4 MM MISC: 1.0000 | Freq: Two times a day (BID) | 2 refills | Status: DC

## 2021-10-22 MED ORDER — DOXYCYCLINE HYCLATE 100 MG PO CAPS
100.0000 mg | ORAL_CAPSULE | Freq: Two times a day (BID) | ORAL | 0 refills | Status: DC
Start: 2021-10-22 — End: 2022-01-10

## 2021-10-22 MED ORDER — METFORMIN HCL ER (OSM) 500 MG PO TB24: 500.0000 mg | ORAL_TABLET | Freq: Every day | ORAL | 1 refills | Status: AC

## 2021-10-22 MED ORDER — DOXYCYCLINE HYCLATE 100 MG PO TABS
100.0000 mg | ORAL_TABLET | Freq: Once | ORAL | Status: AC
  Administered 2021-10-22: 100 mg via ORAL
  Filled 2021-10-22: qty 1

## 2021-10-22 MED ORDER — INSULIN LISPRO PROT & LISPRO (75-25 MIX) 100 UNIT/ML KWIKPEN: PEN_INJECTOR | SUBCUTANEOUS | 6 refills | Status: DC

## 2021-10-22 NOTE — ED Notes (Signed)
Pt states that he is ok with giving a verbal understanding of discharge instructions. Signature pad is not working.

## 2021-10-22 NOTE — Progress Notes (Signed)
10/08/2021, 1:12 PM   Endocrinology follow-up note  Subjective:    Patient ID: Howard Cameron, male    DOB: July 26, 1961.  Howard Cameron is being seen in follow-up after he was seen in consultation for management of currently uncontrolled symptomatic diabetes requested by  Pcp, No.   Past Medical History:  Diagnosis Date   Diabetes mellitus without complication ([place])    Diabetes mellitus, type II ([place])    Pneumonia     Past Surgical History:  Procedure Laterality Date   BACK SURGERY      Social History   Socioeconomic History   Marital status: Single    Spouse name: Not on file   Number of children: Not on file   Years of education: Not on file   Highest education level: Not on file  Occupational History   Not on file  Tobacco Use   Smoking status: Every Day    Packs/day: 1.50    Types: Cigarettes   Smokeless tobacco: Never  Vaping Use   Vaping Use: Never used  Substance and Sexual Activity   Alcohol use: Yes    Comment: couple of drinks a night   Drug use: Not Currently   Sexual activity: Not on file  Other Topics Concern   Not on file  Social History Narrative   Not on file   Social Determinants of Health   Financial Resource Strain: Not on file  Food Insecurity: Not on file  Transportation Needs: Not on file  Physical Activity: Not on file  Stress: Not on file  Social Connections: Not on file    History reviewed. No pertinent family history.  Outpatient Encounter Medications as of 10/08/2021  Medication Sig   CINNAMON PO Take 1 tablet by mouth daily in the afternoon.   MELATONIN GUMMIES PO Take 2 tablets by mouth at bedtime.   Omega-3 Fatty Acids (FISH OIL OMEGA-3 PO) Take 1 capsule by mouth daily in the afternoon.   blood glucose meter kit and supplies Dispense based on patient and insurance preference. Use up to four times daily as directed. (FOR ICD-10 E10.9, E11.9).    metformin (FORTAMET) 500 MG (OSM) 24 hr tablet Take 1 tablet (500 mg total) by mouth 2 (two) times daily with a meal.   ondansetron (ZOFRAN) 4 MG tablet Take 1 tablet (4 mg total) by mouth every 8 (eight) hours as needed for nausea or vomiting.   Vitamin D, Ergocalciferol, (DRISDOL) 1.25 MG (50000 UNIT) CAPS capsule Take 1 capsule (50,000 Units total) by mouth every 7 (seven) days.   [DISCONTINUED] metformin (FORTAMET) 500 MG (OSM) 24 hr tablet Take 1 po daily with supper x 5 days, then 1 po BID with meals x 7 days, then 2 po BID with meals   No facility-administered encounter medications on file as of 10/08/2021.    ALLERGIES: No Known Allergies  VACCINATION STATUS: Immunization History  Administered Date(s) Administered   Influenza,inj,Quad PF,6+ Mos 09/15/2021    Diabetes He presents for his follow-up diabetic visit. He has type 2 diabetes mellitus. Onset time: He was told to have prediabetes since at least 2014, was not diagnosed with diabetes until his recent  hospitalization a month ago. His disease course has been worsening. There are no hypoglycemic associated symptoms. Pertinent negatives for hypoglycemia include no confusion, headaches, pallor or seizures. Associated symptoms include polydipsia and polyuria. Pertinent negatives for diabetes include no chest pain, no fatigue, no polyphagia and no weakness. There are no hypoglycemic complications. Symptoms are worsening. Diabetic complications include peripheral neuropathy. Risk factors for coronary artery disease include male sex, sedentary lifestyle and tobacco exposure. Current diabetic treatments: He takes 2000 mg of metformin ER daily. His weight is stable (Historically, patient weighed more than 200 pounds.  He gives history of heavy alcohol use, still drinks.  He did have unintended weight loss over the years.). He is following a generally unhealthy diet. When asked about meal planning, he reported none. He has not had a previous  visit with a dietitian. He rarely participates in exercise. His home blood glucose trend is fluctuating minimally. His breakfast blood glucose range is generally >200 mg/dl. His lunch blood glucose range is generally >200 mg/dl. His dinner blood glucose range is generally >200 mg/dl. His bedtime blood glucose range is generally >200 mg/dl. His overall blood glucose range is >200 mg/dl. (Patient presents with his logs showing persistent hyperglycemia averaging greater than 200 mg per DL.  He does have both fasting and postprandial hyperglycemia.  His recent A1c was 10.3%.  He did not document any hypoglycemia.   ) An ACE inhibitor/angiotensin II receptor blocker is not being taken.    Review of Systems  Constitutional:  Negative for chills, fatigue, fever and unexpected weight change.  HENT:  Negative for dental problem, mouth sores and trouble swallowing.   Eyes:  Negative for visual disturbance.  Respiratory:  Negative for cough, choking, chest tightness, shortness of breath and wheezing.   Cardiovascular:  Negative for chest pain, palpitations and leg swelling.  Gastrointestinal:  Negative for abdominal distention, abdominal pain, constipation, diarrhea, nausea and vomiting.  Endocrine: Positive for polydipsia and polyuria. Negative for polyphagia.  Genitourinary:  Negative for dysuria, flank pain, hematuria and urgency.  Musculoskeletal:  Negative for back pain, gait problem, myalgias and neck pain.  Skin:  Negative for pallor, rash and wound.  Neurological:  Negative for seizures, syncope, weakness, numbness and headaches.  Psychiatric/Behavioral:  Negative for confusion and dysphoric mood.    Objective:    Vitals with BMI 10/08/2021 09/16/2021 09/15/2021  Height 5' 10"  - -  Weight 174 lbs 10 oz - -  BMI 03.55 - -  Systolic 974 163 845  Diastolic 72 84 69  Pulse 364 88 98    BP 108/72   Pulse (!) 116   Ht 5' 10"  (1.778 m)   Wt 174 lb 9.6 oz (79.2 kg)   BMI 25.05 kg/m   Wt  Readings from Last 3 Encounters:  10/08/21 174 lb 9.6 oz (79.2 kg)  09/14/21 181 lb 3.5 oz (82.2 kg)  09/11/21 175 lb (79.4 kg)     Physical Exam Constitutional:      General: He is not in acute distress.    Appearance: He is well-developed.  HENT:     Head: Normocephalic and atraumatic.  Neck:     Thyroid: No thyromegaly.     Trachea: No tracheal deviation.  Cardiovascular:     Rate and Rhythm: Normal rate.     Pulses:          Dorsalis pedis pulses are 1+ on the right side and 1+ on the left side.       Posterior  tibial pulses are 1+ on the right side and 1+ on the left side.     Heart sounds: Normal heart sounds, S1 normal and S2 normal. No murmur heard.   No gallop.  Pulmonary:     Effort: Pulmonary effort is normal. No respiratory distress.     Breath sounds: No wheezing.  Abdominal:     General: There is no distension.     Tenderness: There is no abdominal tenderness. There is no guarding.  Musculoskeletal:     Right shoulder: No swelling or deformity.     Cervical back: Normal range of motion and neck supple.     Comments: He had blunt, crushing accident on his left toe for which he went to ER yesterday.  He was diagnosed with cellulitis and was given antibiotics.  He was also given antibiotics to take as an outpatient.  He did not fill his prescriptions.  Skin:    General: Skin is warm and dry.     Findings: No rash.     Nails: There is no clubbing.  Neurological:     Mental Status: He is alert and oriented to person, place, and time.     Cranial Nerves: No cranial nerve deficit.     Sensory: No sensory deficit.     Gait: Gait normal.     Deep Tendon Reflexes: Reflexes are normal and symmetric.     Comments: Diminished monofilament test sensation on bilateral lower extremities, diminished dorsalis pedis pulse on bilateral feet.  Psychiatric:        Speech: Speech normal.        Behavior: Behavior normal. Behavior is cooperative.        Thought Content: Thought  content normal.        Judgment: Judgment normal.      CMP ( most recent) CMP     Component Value Date/Time   NA 136 09/16/2021 0457   K 3.1 (L) 09/16/2021 0457   CL 101 09/16/2021 0457   CO2 28 09/16/2021 0457   GLUCOSE 191 (H) 09/16/2021 0457   BUN 10 09/16/2021 0457   CREATININE 0.63 09/16/2021 0457   CALCIUM 8.0 (L) 09/16/2021 0457   PROT 5.3 (L) 09/16/2021 0457   ALBUMIN 2.0 (L) 09/16/2021 0457   AST 51 (H) 09/16/2021 0457   ALT 51 (H) 09/16/2021 0457   ALKPHOS 72 09/16/2021 0457   BILITOT 0.7 09/16/2021 0457   GFRNONAA >60 09/16/2021 0457     Diabetic Labs (most recent): Lab Results  Component Value Date   HGBA1C 10.3 (H) 09/14/2021     Lab Results  Component Value Date   TSH 1.550 09/15/2021      Assessment & Plan:   1. Poorly controlled type 2 diabetes mellitus ([place]) - Howard Cameron has currently uncontrolled symptomatic type 2 DM since  60 years of age (history of prediabetes for 8 years prior to diagnosis with type 2 diabetes) with most recent A1c of 10.3 %.  He had blunt, crushing accident on his left toe for which he went to ER yesterday.  He was diagnosed with cellulitis and was given antibiotics.  He was also given antibiotics to take as an outpatient.  He did not fill his prescriptions.  -I reviewed his recent labs with him . - I had a long discussion with him about the progressive nature of diabetes and the pathology behind its complications. -his diabetes is complicated by neuropathy, significant alcohol history, continued heavy smoking and he remains at  a high risk for more acute and chronic complications which include CAD, CVA, CKD, retinopathy, and neuropathy. These are all discussed in detail with him.  - I have counseled him on diet  and weight management  by adopting a carbohydrate restricted/protein rich diet. Patient is encouraged to switch to  unprocessed or minimally processed     complex starch and increased protein intake (animal or  plant source), fruits, and vegetables. -  he is advised to stick to a routine mealtimes to eat 3 meals  a day and avoid unnecessary snacks ( to snack only to correct hypoglycemia).   - he acknowledges that there is a room for improvement in his food and drink choices. - Suggestion is made for him to avoid simple carbohydrates  from his diet including Cakes, Sweet Desserts, Ice Cream, Soda (diet and regular), Sweet Tea, Candies, Chips, Cookies, Store Bought Juices, Alcohol in Excess of  1-2 drinks a day, Artificial Sweeteners,  Coffee Creamer, and "Sugar-free" Products, Lemonade. This will help patient to have more stable blood glucose profile and potentially avoid unintended weight gain.  - he will be scheduled with Jearld Fenton, RDN, CDE for diabetes education.  - I have approached him with the following individualized plan to manage  his diabetes and patient agrees:   -Risk of pancreatic diabetes in this patient is high. -In light of his presentation with severe, persistent hyperglycemia and recent diagnosis of cellulitis of left lower extremity, he would benefit from early initiation of insulin treatment. -I discussed initiated NovoLog 70/30 20 units with breakfast and 20 units with supper when his Premeal blood glucose readings are above 90 mg/dl.  He is advised to continue monitoring blood glucose 4 times a day-daily before meals and at bedtime.  -He is advised to return in 2 weeks for reevaluation. - he is encouraged to call clinic for blood glucose levels less than 70 or above 200 mg /dl. -He is encouraged to fill his antibiotics as soon as possible and start soon as possible. - he is advised to lower his metformin to 500 mg once a day at breakfast only.  -He is not a suitable candidate for incretin therapy.   - he will be considered for incretin therapy as appropriate next visit.  - Specific targets for  A1c;  LDL, HDL,  and Triglycerides were discussed with the patient.  2)  Blood Pressure /Hypertension: His blood pressure is marginal at 84/58.  he is not on any antihypertensive medications.  He is advised to return to ER if he develops fever, loss of consciousness, or color change of injured part on the left lower extremity.     3) Lipids/Hyperlipidemia: He does not have recent lipid panel to review.  He is advised to continue his omega-3 fatty acids 1 capsule twice daily, he will be considered for fasting lipid panel on subsequent visits.  He is to continue his vitamin D treatment with 50,000 units weekly.  4)  Weight/Diet:  Body mass index is 25.05 kg/m.  -    he is not a candidate for weight loss.  Exercise, and detailed carbohydrates information provided  -  detailed on discharge instructions.  5) Chronic Care/Health Maintenance:  -he  is not  on ACEI/ARB and Statin medications and  is encouraged to initiate and continue to follow up with Ophthalmology, Dentist,  Podiatrist at least yearly or according to recommendations, and advised to  quit smoking. I have recommended yearly flu vaccine and pneumonia vaccine at least  every 5 years; moderate intensity exercise for up to 150 minutes weekly; and  sleep for at least 7 hours a day.   The patient was counseled on the dangers of tobacco use, and was advised to quit.  Reviewed strategies to maximize success, including removing cigarettes and smoking materials from environment.  -He is also encouraged to consider weaning off of alcohol.   - he is  advised to maintain close follow up with Pcp, No for primary care needs, as well as his other providers for optimal and coordinated care.    I spent 41 minutes in the care of the patient today including review of labs from [place], Lipids, Thyroid Function, Hematology (current and previous including abstractions from other facilities); face-to-face time discussing  his blood glucose readings/logs, discussing hypoglycemia and hyperglycemia episodes and symptoms, medications  doses, his options of short and long term treatment based on the latest standards of care / guidelines;  discussion about incorporating lifestyle medicine;  and documenting the encounter.    Please refer to Patient Instructions for Blood Glucose Monitoring and Insulin/Medications Dosing Guide"  in media tab for additional information. Please  also refer to " Patient Self Inventory" in the Media  tab for reviewed elements of pertinent patient history.  Rayburn Felt participated in the discussions, expressed understanding, and voiced agreement with the above plans.  All questions were answered to his satisfaction. he is encouraged to contact clinic should he have any questions or concerns prior to his return visit.     Follow up plan: - Return in about 10 days (around 10/18/2021) for F/U with Meter and Logs Only - no Labs.  Glade Lloyd, MD [facility] [address] [place], [place] [address] Phone: [phone]  Fax: [phone]    10/08/2021, 1:12 PM  This note was partially dictated with voice recognition software. Similar sounding words can be transcribed inadequately or may not  be corrected upon review.

## 2021-10-22 NOTE — Patient Instructions (Signed)

## 2021-11-05 ENCOUNTER — Other Ambulatory Visit: Payer: Self-pay

## 2021-11-05 ENCOUNTER — Ambulatory Visit (INDEPENDENT_AMBULATORY_CARE_PROVIDER_SITE_OTHER): Payer: 59 | Admitting: "Endocrinology

## 2021-11-05 ENCOUNTER — Encounter: Payer: Self-pay | Admitting: "Endocrinology

## 2021-11-05 VITALS — BP 119/77 | HR 109 | Ht 70.0 in | Wt 180.0 lb

## 2021-11-05 DIAGNOSIS — E559 Vitamin D deficiency, unspecified: Secondary | ICD-10-CM

## 2021-11-05 DIAGNOSIS — E1165 Type 2 diabetes mellitus with hyperglycemia: Secondary | ICD-10-CM

## 2021-11-05 DIAGNOSIS — F17200 Nicotine dependence, unspecified, uncomplicated: Secondary | ICD-10-CM

## 2021-11-05 NOTE — Progress Notes (Signed)
11/05/2021, 1:33 PM   Endocrinology follow-up note  Subjective:    Patient ID: Howard Cameron, male    DOB: 14-Jan-1961.  Howard Cameron is being seen in follow-up after he was seen in consultation for management of currently uncontrolled symptomatic diabetes requested by  System, Provider Not In.   Past Medical History:  Diagnosis Date   Diabetes mellitus without complication ([place])    Diabetes mellitus, type II ([place])    Pneumonia     Past Surgical History:  Procedure Laterality Date   BACK SURGERY      Social History   Socioeconomic History   Marital status: Single    Spouse name: Not on file   Number of children: Not on file   Years of education: Not on file   Highest education level: Not on file  Occupational History   Not on file  Tobacco Use   Smoking status: Every Day    Packs/day: 1.50    Types: Cigarettes   Smokeless tobacco: Never  Vaping Use   Vaping Use: Never used  Substance and Sexual Activity   Alcohol use: Yes    Comment: couple of drinks a night   Drug use: Not Currently   Sexual activity: Not on file  Other Topics Concern   Not on file  Social History Narrative   Not on file   Social Determinants of Health   Financial Resource Strain: Not on file  Food Insecurity: Not on file  Transportation Needs: Not on file  Physical Activity: Not on file  Stress: Not on file  Social Connections: Not on file    History reviewed. No pertinent family history.  Outpatient Encounter Medications as of 11/05/2021  Medication Sig   blood glucose meter kit and supplies Dispense based on patient and insurance preference. Use up to four times daily as directed. (FOR ICD-10 E10.9, E11.9).   CINNAMON PO Take 1 tablet by mouth daily in the afternoon.   ciprofloxacin (CIPRO) 500 MG tablet Take 500 mg by mouth 2 (two) times daily.   doxycycline (VIBRAMYCIN) 100 MG capsule Take 1 capsule  (100 mg total) by mouth 2 (two) times daily. (Patient not taking: Reported on 11/05/2021)   Insulin Lispro Prot & Lispro (HUMALOG 75/25 MIX) (75-25) 100 UNIT/ML Kwikpen Inject 20 Units into the skin 2 (two) times daily before a meal.   Insulin Pen Needle (BD PEN NEEDLE NANO U/F) 32G X 4 MM MISC 1 each by Does not apply route 2 (two) times daily before a meal.   MELATONIN GUMMIES PO Take 2 tablets by mouth at bedtime.   metformin (FORTAMET) 500 MG (OSM) 24 hr tablet Take 1 tablet (500 mg total) by mouth daily with breakfast.   Omega-3 Fatty Acids (FISH OIL OMEGA-3 PO) Take 1 capsule by mouth daily in the afternoon.   ondansetron (ZOFRAN) 4 MG tablet Take 1 tablet (4 mg total) by mouth every 8 (eight) hours as needed for nausea or vomiting.   traZODone (DESYREL) 50 MG tablet Take 50 mg by mouth at bedtime.   Vitamin D, Ergocalciferol, (DRISDOL) 1.25 MG (50000 UNIT) CAPS capsule Take 1 capsule (50,000 Units total) by mouth  every 7 (seven) days.   No facility-administered encounter medications on file as of 11/05/2021.    ALLERGIES: No Known Allergies  VACCINATION STATUS: Immunization History  Administered Date(s) Administered   Influenza,inj,Quad PF,6+ Mos 09/15/2021    Diabetes He presents for his follow-up diabetic visit. He has type 2 diabetes mellitus. Onset time: He was told to have prediabetes since at least 2014, was not diagnosed with diabetes until his recent hospitalization a month ago. His disease course has been improving. There are no hypoglycemic associated symptoms. Pertinent negatives for hypoglycemia include no confusion, headaches, pallor or seizures. Pertinent negatives for diabetes include no chest pain, no fatigue, no polydipsia, no polyphagia, no polyuria and no weakness. There are no hypoglycemic complications. Symptoms are improving. Diabetic complications include peripheral neuropathy and PVD. Risk factors for coronary artery disease include male sex, sedentary lifestyle  and tobacco exposure. Current diabetic treatments: He takes 2000 mg of metformin ER daily. His weight is increasing steadily (Historically, patient weighed more than 200 pounds.  He gives history of heavy alcohol use, still drinks.  He did have unintended weight loss over the years, gained 5 pounds since last visit, a good development for him). He is following a generally unhealthy diet. When asked about meal planning, he reported none. He has not had a previous visit with a dietitian. He rarely participates in exercise. His home blood glucose trend is decreasing steadily. His breakfast blood glucose range is generally 140-180 mg/dl. His lunch blood glucose range is generally 140-180 mg/dl. His dinner blood glucose range is generally 140-180 mg/dl. His bedtime blood glucose range is generally 130-140 mg/dl. His overall blood glucose range is 140-180 mg/dl. (Patient presents with significant improvement in his glycemic profile to near target limits.  This is despite his recent A1c of 10.3%.  He did not document any sustained major hypoglycemia.   ) An ACE inhibitor/angiotensin II receptor blocker is not being taken.    Review of Systems  Constitutional:  Negative for chills, fatigue, fever and unexpected weight change.  HENT:  Negative for dental problem, mouth sores and trouble swallowing.   Eyes:  Negative for visual disturbance.  Respiratory:  Negative for cough, choking, chest tightness, shortness of breath and wheezing.   Cardiovascular:  Negative for chest pain, palpitations and leg swelling.  Gastrointestinal:  Negative for abdominal distention, abdominal pain, constipation, diarrhea, nausea and vomiting.  Endocrine: Negative for polydipsia, polyphagia and polyuria.  Genitourinary:  Negative for dysuria, flank pain, hematuria and urgency.  Musculoskeletal:  Negative for back pain, gait problem, myalgias and neck pain.  Skin:  Negative for pallor, rash and wound.  Neurological:  Negative for  seizures, syncope, weakness, numbness and headaches.  Psychiatric/Behavioral:  Negative for confusion and dysphoric mood.    Objective:    Vitals with BMI 11/05/2021 10/22/2021 10/22/2021  Height 5' 10"  5' 10"  -  Weight 180 lbs 175 lbs 13 oz -  BMI 76.22 63.33 -  Systolic 545 84 625  Diastolic 77 58 84  Pulse 638 120 115    BP 119/77   Pulse (!) 109   Ht 5' 10"  (1.778 m)   Wt 180 lb (81.6 kg)   BMI 25.83 kg/m   Wt Readings from Last 3 Encounters:  11/05/21 180 lb (81.6 kg)  10/22/21 175 lb 12.8 oz (79.7 kg)  10/21/21 175 lb (79.4 kg)     Physical Exam Constitutional:      General: He is not in acute distress.    Appearance: He  is well-developed.  HENT:     Head: Normocephalic and atraumatic.  Neck:     Thyroid: No thyromegaly.     Trachea: No tracheal deviation.  Cardiovascular:     Rate and Rhythm: Normal rate.     Pulses:          Dorsalis pedis pulses are 1+ on the right side and 1+ on the left side.       Posterior tibial pulses are 1+ on the right side and 1+ on the left side.     Heart sounds: Normal heart sounds, S1 normal and S2 normal. No murmur heard.   No gallop.  Pulmonary:     Effort: Pulmonary effort is normal. No respiratory distress.     Breath sounds: No wheezing.  Abdominal:     General: There is no distension.     Tenderness: There is no abdominal tenderness. There is no guarding.  Musculoskeletal:     Right shoulder: No swelling or deformity.     Cervical back: Normal range of motion and neck supple.     Comments: He had blunt, crushing accident on his left toe for which he went to ER yesterday.  He was diagnosed with cellulitis and was given antibiotics.  He was also given antibiotics to take as an outpatient.  He did not fill his prescriptions.  Skin:    General: Skin is warm and dry.     Findings: No rash.     Nails: There is no clubbing.  Neurological:     Mental Status: He is alert and oriented to person, place, and time.     Cranial  Nerves: No cranial nerve deficit.     Sensory: No sensory deficit.     Gait: Gait normal.     Deep Tendon Reflexes: Reflexes are normal and symmetric.     Comments: Diminished monofilament test sensation on bilateral lower extremities, diminished dorsalis pedis pulse on bilateral feet.  Psychiatric:        Speech: Speech normal.        Behavior: Behavior normal. Behavior is cooperative.        Thought Content: Thought content normal.        Judgment: Judgment normal.      CMP ( most recent) CMP     Component Value Date/Time   NA 132 (L) 10/22/2021 0004   K 3.9 10/22/2021 0004   CL 98 10/22/2021 0004   CO2 23 10/22/2021 0004   GLUCOSE 237 (H) 10/22/2021 0004   BUN 15 10/22/2021 0004   CREATININE 0.74 10/22/2021 0004   CALCIUM 8.9 10/22/2021 0004   PROT 5.3 (L) 09/16/2021 0457   ALBUMIN 2.0 (L) 09/16/2021 0457   AST 51 (H) 09/16/2021 0457   ALT 51 (H) 09/16/2021 0457   ALKPHOS 72 09/16/2021 0457   BILITOT 0.7 09/16/2021 0457   GFRNONAA >60 10/22/2021 0004     Diabetic Labs (most recent): Lab Results  Component Value Date   HGBA1C 10.3 (H) 09/14/2021     Lab Results  Component Value Date   TSH 1.550 09/15/2021      Assessment & Plan:   1. Poorly controlled type 2 diabetes mellitus ([place]) - Howard Cameron has currently uncontrolled symptomatic type 2 DM since  60 years of age (history of prediabetes for 8 years prior to diagnosis with type 2 diabetes) with most recent A1c of 10.3 %.  Patient presents with significant improvement in his glycemic profile to near target limits.  This is despite his recent A1c of 10.3%.  He did not document any sustained major hypoglycemia.    -I reviewed his recent labs with him . - I had a long discussion with him about the progressive nature of diabetes and the pathology behind its complications. -his diabetes is complicated by neuropathy, significant alcohol history, continued heavy smoking and he remains at a high risk for more  acute and chronic complications which include CAD, CVA, CKD, retinopathy, and neuropathy. These are all discussed in detail with him.  - I have counseled him on diet  and weight management  by adopting a carbohydrate restricted/protein rich diet. Patient is encouraged to switch to  unprocessed or minimally processed     complex starch and increased protein intake (animal or plant source), fruits, and vegetables. -  he is advised to stick to a routine mealtimes to eat 3 meals  a day and avoid unnecessary snacks ( to snack only to correct hypoglycemia).   - he acknowledges that there is a room for improvement in his food and drink choices. - Suggestion is made for him to avoid simple carbohydrates  from his diet including Cakes, Sweet Desserts, Ice Cream, Soda (diet and regular), Sweet Tea, Candies, Chips, Cookies, Store Bought Juices, Alcohol in Excess of  1-2 drinks a day, Artificial Sweeteners,  Coffee Creamer, and "Sugar-free" Products, Lemonade. This will help patient to have more stable blood glucose profile and potentially avoid unintended weight gain.' - he will be scheduled with Jearld Fenton, RDN, CDE for diabetes education.  - I have approached him with the following individualized plan to manage  his diabetes and patient agrees:   -Risk of pancreatic diabetes in this patient is high. -In light of his presentation with severe, persistent hyperglycemia and recent diagnosis of cellulitis of left lower extremity, he would benefit from early initiation of insulin treatment. -He has responded to that premixed insulin Humalog 75/25.  He is advised to continue Humalog 75/25 20 units with breakfast and 20 units with supper associated with monitoring of blood glucose 4 times a day-daily before meals and at bedtime.  He is advised not to inject insulin unless his Premeal blood glucose readings above 90 mg per DL.  - he is encouraged to call clinic for blood glucose levels less than 70 or above 200 mg  /dl. -He is encouraged to fill his antibiotics as soon as possible and start soon as possible. - he is advised to continue metformin 500 mg p.o. daily after breakfast.   -He is not a suitable candidate for incretin therapy, nor a full dose of metformin.  - Specific targets for  A1c;  LDL, HDL,  and Triglycerides were discussed with the patient.  2) Blood Pressure /Hypertension: His blood pressure is controlled at 119/77.  he is not on any antihypertensive medications.  He is advised to return to ER if he develops fever, loss of consciousness, or color change of injured part on the left lower extremity.     3) Lipids/Hyperlipidemia: He does not have recent lipid panel to review.  He is advised to continue his omega-3 fatty acids 1 capsule twice daily, he will be considered for fasting lipid panel on subsequent visits.  He is to continue his vitamin D treatment with 50,000 units weekly.  4)  Weight/Diet:  Body mass index is 25.83 kg/m.  -    he is not a candidate for weight loss.  Exercise, and detailed carbohydrates information provided  -  detailed on discharge instructions.  5) Chronic Care/Health Maintenance:  -he  is not  on ACEI/ARB and Statin medications and  is encouraged to initiate and continue to follow up with Ophthalmology, Dentist,  Podiatrist at least yearly or according to recommendations, and advised to  quit smoking. I have recommended yearly flu vaccine and pneumonia vaccine at least every 5 years; moderate intensity exercise for up to 150 minutes weekly; and  sleep for at least 7 hours a day.  The patient was counseled on the dangers of tobacco use, and was advised to quit.  Reviewed strategies to maximize success, including removing cigarettes and smoking materials from environment.   -He is also encouraged to consider weaning off of alcohol.   - he is  advised to maintain close follow up with System, Provider Not In for primary care needs, as well as his other providers  for optimal and coordinated care.   I spent 41 minutes in the care of the patient today including review of labs from [place], Lipids, Thyroid Function, Hematology (current and previous including abstractions from other facilities); face-to-face time discussing  his blood glucose readings/logs, discussing hypoglycemia and hyperglycemia episodes and symptoms, medications doses, his options of short and long term treatment based on the latest standards of care / guidelines;  discussion about incorporating lifestyle medicine;  and documenting the encounter.    Please refer to Patient Instructions for Blood Glucose Monitoring and Insulin/Medications Dosing Guide"  in media tab for additional information. Please  also refer to " Patient Self Inventory" in the Media  tab for reviewed elements of pertinent patient history.  Rayburn Felt participated in the discussions, expressed understanding, and voiced agreement with the above plans.  All questions were answered to his satisfaction. he is encouraged to contact clinic should he have any questions or concerns prior to his return visit.     Follow up plan: - Return in about 6 weeks (around 12/17/2021) for Bring Meter and Logs- A1c in Office.  Glade Lloyd, MD [facility] [address] [place], [place] [address] Phone: [phone]  Fax: [phone]    11/05/2021, 1:33 PM  This note was partially dictated with voice recognition software. Similar sounding words can be transcribed inadequately or may not  be corrected upon review.

## 2021-11-05 NOTE — Patient Instructions (Signed)

## 2021-12-18 ENCOUNTER — Ambulatory Visit: Payer: 59 | Admitting: "Endocrinology

## 2021-12-25 ENCOUNTER — Telehealth: Payer: Self-pay | Admitting: "Endocrinology

## 2021-12-25 NOTE — Telephone Encounter (Signed)
Pt's fiance called and said that the humalog pen is not working properly, she contacted the pharmacy back and they are telling her she has to contact [facility]. Please Advise, she does not know what to do because he is about to he out of insulin. She has tried to call [facility] and can not get through to them so she called [place]. I told her I am not sure.

## 2021-12-25 NOTE — Telephone Encounter (Signed)
Left a message requesting a return call to the office. 

## 2021-12-25 NOTE — Telephone Encounter (Signed)
Called patient and spoke to [facility] and let her know that I called the [facility] on [address] and the pharmacist stated that he was not given enough pens and will get a few for him. I also advised for patient to contact [facility] to get a replacement pen for the broken pen he has. Patient verbalized an understanding.

## 2022-01-10 ENCOUNTER — Ambulatory Visit (INDEPENDENT_AMBULATORY_CARE_PROVIDER_SITE_OTHER): Payer: 59 | Admitting: "Endocrinology

## 2022-01-10 ENCOUNTER — Other Ambulatory Visit: Payer: Self-pay

## 2022-01-10 ENCOUNTER — Encounter: Payer: Self-pay | Admitting: "Endocrinology

## 2022-01-10 VITALS — BP 136/80 | HR 96 | Ht 70.0 in | Wt 198.6 lb

## 2022-01-10 DIAGNOSIS — E1165 Type 2 diabetes mellitus with hyperglycemia: Secondary | ICD-10-CM

## 2022-01-10 MED ORDER — INSULIN LISPRO PROT & LISPRO (75-25 MIX) 100 UNIT/ML KWIKPEN: PEN_INJECTOR | SUBCUTANEOUS | 6 refills | Status: DC

## 2022-01-10 NOTE — Progress Notes (Signed)
01/10/2022, 5:08 PM   Endocrinology follow-up note  Subjective:    Patient ID: Howard Cameron, male    DOB: 04-21-1961.  Howard Cameron is being seen in follow-up after he was seen in consultation for management of currently uncontrolled symptomatic diabetes requested by  System, Provider Not In.   Past Medical History:  Diagnosis Date   Diabetes mellitus without complication ([place])    Diabetes mellitus, type II ([place])    Pneumonia     Past Surgical History:  Procedure Laterality Date   BACK SURGERY      Social History   Socioeconomic History   Marital status: Single    Spouse name: Not on file   Number of children: Not on file   Years of education: Not on file   Highest education level: Not on file  Occupational History   Not on file  Tobacco Use   Smoking status: Every Day    Packs/day: 1.50    Types: Cigarettes   Smokeless tobacco: Never  Vaping Use   Vaping Use: Never used  Substance and Sexual Activity   Alcohol use: Yes    Comment: couple of drinks a night   Drug use: Not Currently   Sexual activity: Not on file  Other Topics Concern   Not on file  Social History Narrative   Not on file   Social Determinants of Health   Financial Resource Strain: Not on file  Food Insecurity: Not on file  Transportation Needs: Not on file  Physical Activity: Not on file  Stress: Not on file  Social Connections: Not on file    History reviewed. No pertinent family history.  Outpatient Encounter Medications as of 01/10/2022  Medication Sig   Cholecalciferol (VITAMIN D-3) 125 MCG (5000 UT) TABS Take 1 tablet by mouth daily in the afternoon.   blood glucose meter kit and supplies Dispense based on patient and insurance preference. Use up to four times daily as directed. (FOR ICD-10 E10.9, E11.9).   CINNAMON PO Take 1 tablet by mouth daily in the afternoon.   Insulin Lispro Prot & Lispro  (HUMALOG 75/25 MIX) (75-25) 100 UNIT/ML Kwikpen Inject 10 Units into the skin 2 (two) times daily before a meal.   Insulin Pen Needle (BD PEN NEEDLE NANO U/F) 32G X 4 MM MISC 1 each by Does not apply route 2 (two) times daily before a meal.   MELATONIN GUMMIES PO Take 2 tablets by mouth at bedtime.   metformin (FORTAMET) 500 MG (OSM) 24 hr tablet Take 1 tablet (500 mg total) by mouth daily with breakfast.   Omega-3 Fatty Acids (FISH OIL OMEGA-3 PO) Take 1 capsule by mouth daily in the afternoon.   ondansetron (ZOFRAN) 4 MG tablet Take 1 tablet (4 mg total) by mouth every 8 (eight) hours as needed for nausea or vomiting.   traZODone (DESYREL) 50 MG tablet Take 50 mg by mouth at bedtime.   Vitamin D, Ergocalciferol, (DRISDOL) 1.25 MG (50000 UNIT) CAPS capsule Take 1 capsule (50,000 Units total) by mouth every 7 (seven) days. (Patient not taking: Reported on 01/10/2022)   [DISCONTINUED] ciprofloxacin (CIPRO) 500 MG tablet Take 500 mg by  mouth 2 (two) times daily.   [DISCONTINUED] doxycycline (VIBRAMYCIN) 100 MG capsule Take 1 capsule (100 mg total) by mouth 2 (two) times daily. (Patient not taking: Reported on 11/05/2021)   [DISCONTINUED] Insulin Lispro Prot & Lispro (HUMALOG 75/25 MIX) (75-25) 100 UNIT/ML Kwikpen Inject 20 Units into the skin 2 (two) times daily before a meal.   No facility-administered encounter medications on file as of 01/10/2022.    ALLERGIES: No Known Allergies  VACCINATION STATUS: Immunization History  Administered Date(s) Administered   Influenza,inj,Quad PF,6+ Mos 09/15/2021    Diabetes He presents for his follow-up diabetic visit. He has type 2 diabetes mellitus. Onset time: He was told to have prediabetes since at least 2014, was not diagnosed with diabetes until his recent hospitalization a month ago. His disease course has been improving. There are no hypoglycemic associated symptoms. Pertinent negatives for hypoglycemia include no confusion, headaches, pallor or  seizures. Pertinent negatives for diabetes include no chest pain, no fatigue, no polydipsia, no polyphagia, no polyuria and no weakness. There are no hypoglycemic complications. Symptoms are improving. Diabetic complications include peripheral neuropathy and PVD. Risk factors for coronary artery disease include male sex, sedentary lifestyle and tobacco exposure. Current diabetic treatments: He takes 2000 mg of metformin ER daily. His weight is increasing steadily (Historically, patient weighed more than 200 pounds.  He gives history of heavy alcohol use, still drinks.  He did have unintended weight loss over the years, gained 5 pounds since last visit, a good development for him). He is following a generally unhealthy diet. When asked about meal planning, he reported none. He has not had a previous visit with a dietitian. He rarely participates in exercise. His home blood glucose trend is decreasing steadily. His breakfast blood glucose range is generally 140-180 mg/dl. His bedtime blood glucose range is generally 140-180 mg/dl. His overall blood glucose range is 140-180 mg/dl. (Mr. Amsden presents with significant improvement in his glycemic profile and previsit A1c of 5.5% improving from 10.3%.  He did not document any hypoglycemia.  His average blood glucose 145 for the last 30 days.  In a positive development, he gained 18 pounds of weight which is plus for him.  He felt strong enough to go to back to work.  ) An ACE inhibitor/angiotensin II receptor blocker is not being taken.    Review of Systems  Constitutional:  Negative for chills, fatigue, fever and unexpected weight change.  HENT:  Negative for dental problem, mouth sores and trouble swallowing.   Eyes:  Negative for visual disturbance.  Respiratory:  Negative for cough, choking, chest tightness, shortness of breath and wheezing.   Cardiovascular:  Negative for chest pain, palpitations and leg swelling.  Gastrointestinal:  Negative for abdominal  distention, abdominal pain, constipation, diarrhea, nausea and vomiting.  Endocrine: Negative for polydipsia, polyphagia and polyuria.  Genitourinary:  Negative for dysuria, flank pain, hematuria and urgency.  Musculoskeletal:  Negative for back pain, gait problem, myalgias and neck pain.  Skin:  Negative for pallor, rash and wound.  Neurological:  Negative for seizures, syncope, weakness, numbness and headaches.  Psychiatric/Behavioral:  Negative for confusion and dysphoric mood.    Objective:    Vitals with BMI 01/10/2022 11/05/2021 10/22/2021  Height _0  _1  _2   Weight 198 lbs 10 oz 180 lbs 175 lbs 13 oz  BMI 28.5 94.85 46.27  Systolic 035 009 84  Diastolic 80 77 58  Pulse 96 109 120    BP 136/80    Pulse  96    Ht _0  (1.778 m)    Wt 198 lb 9.6 oz (90.1 kg)    BMI 28.50 kg/m   Wt Readings from Last 3 Encounters:  01/10/22 198 lb 9.6 oz (90.1 kg)  11/05/21 180 lb (81.6 kg)  10/22/21 175 lb 12.8 oz (79.7 kg)      CMP ( most recent) CMP     Component Value Date/Time   NA 132 (L) 10/22/2021 0004   K 3.9 10/22/2021 0004   CL 98 10/22/2021 0004   CO2 23 10/22/2021 0004   GLUCOSE 237 (H) 10/22/2021 0004   BUN 15 10/22/2021 0004   CREATININE 0.74 10/22/2021 0004   CALCIUM 8.9 10/22/2021 0004   PROT 5.3 (L) 09/16/2021 0457   ALBUMIN 2.0 (L) 09/16/2021 0457   AST 51 (H) 09/16/2021 0457   ALT 51 (H) 09/16/2021 0457   ALKPHOS 72 09/16/2021 0457   BILITOT 0.7 09/16/2021 0457   GFRNONAA >60 10/22/2021 0004     Diabetic Labs (most recent): Lab Results  Component Value Date   HGBA1C 10.3 (H) 09/14/2021     Lab Results  Component Value Date   TSH 1.550 09/15/2021      Assessment & Plan:   1. Poorly controlled type 2 diabetes mellitus ([place]) - ARMAN LOY has currently uncontrolled symptomatic type 2 DM since  61 years of age (history of prediabetes for 8 years prior to diagnosis with type 2 diabetes) with most recent A1c of 10.3 %.    Mr. Semper  presents with significant improvement in his glycemic profile and previsit A1c of 5.5% improving from 10.3%.  He did not document any hypoglycemia.  His average blood glucose 145 for the last 30 days.  In a positive development, he gained 18 pounds of weight which is plus for him.  He felt strong enough to go to back to work.  -I reviewed his recent labs with him . - I had a long discussion with him about the progressive nature of diabetes and the pathology behind its complications. -his diabetes is complicated by neuropathy, significant alcohol history, continued heavy smoking and he remains at a high risk for more acute and chronic complications which include CAD, CVA, CKD, retinopathy, and neuropathy. These are all discussed in detail with him.  - I have counseled him on diet  and weight management  by adopting a carbohydrate restricted/protein rich diet. Patient is encouraged to switch to  unprocessed or minimally processed     complex starch and increased protein intake (animal or plant source), fruits, and vegetables. -  he is advised to stick to a routine mealtimes to eat 3 meals  a day and avoid unnecessary snacks ( to snack only to correct hypoglycemia).    - he acknowledges that there is a room for improvement in his food and drink choices. - Suggestion is made for him to avoid simple carbohydrates  from his diet including Cakes, Sweet Desserts, Ice Cream, Soda (diet and regular), Sweet Tea, Candies, Chips, Cookies, Store Bought Juices, Alcohol , Artificial Sweeteners,  Coffee Creamer, and "Sugar-free" Products, Lemonade. This will help patient to have more stable blood glucose profile and potentially avoid unintended weight gain.  The following Lifestyle Medicine recommendations according to [place]  [facility]) were discussed and and offered to patient and he  agrees to start the journey:  A. Whole Foods, Plant-Based Nutrition comprising of fruits and vegetables,  plant-based proteins, whole-grain carbohydrates was discussed  in detail with the patient.   A list for source of those nutrients were also provided to the patient.  Patient will use only water or unsweetened tea for hydration. B.  The need to stay away from risky substances including alcohol, smoking; obtaining 7 to 9 hours of restorative sleep, at least 150 minutes of moderate intensity exercise weekly, the importance of healthy social connections,  and stress management techniques were discussed.  - I have approached him with the following individualized plan to manage  his diabetes and patient agrees:   -Risk of pancreatic diabetes in this patient is high. -In light of his presentation with tightening glycemic profile and A1c of 5.5%, he is considered for lower dose of insulin.  I discussed and lowered his Humalog 70 5/25 to 10 units with breakfast and 10 units with supper   associated with monitoring of blood glucose 4 times a day-daily before meals and at bedtime.  He is advised not to inject insulin unless his P[place] blood glucose readings above 90 mg per DL. -This patient will benefit from a CGM, will be considered for next visit.  - he is encouraged to call clinic for blood glucose levels less than 70 or above 200 mg /dl. - he is advised to continue metformin 500 mg p.o. daily after breakfast.   -He is not a suitable candidate for incretin therapy, nor a full dose of metformin.  - Specific targets for  A1c;  LDL, HDL,  and Triglycerides were discussed with the patient.  2) Blood Pressure /Hypertension: His blood pressure is controlled.  he is not on any antihypertensive medications.  Marland Kitchen    3) Lipids/Hyperlipidemia: He does not have recent lipid panel to review.  He is advised to continue his omega-3 fatty acids 1 capsule twice daily, he will be considered for fasting lipid panel on subsequent visits.  He is status posttreatment with high-dose vitamin D weekly.     4)  Weight/Diet:  Body  mass index is 28.5 kg/m.  -    he is not a candidate for weight loss.  Exercise, and detailed carbohydrates information provided  -  detailed on discharge instructions.  5) Chronic Care/Health Maintenance:  -he  is not  on ACEI/ARB and Statin medications and  is encouraged to initiate and continue to follow up with Ophthalmology, Dentist,  Podiatrist at least yearly or according to recommendations, and advised to  quit smoking. I have recommended yearly flu vaccine and pneumonia vaccine at least every 5 years; moderate intensity exercise for up to 150 minutes weekly; and  sleep for at least 7 hours a day.  The patient was counseled on the dangers of tobacco use, and was advised to quit.  Reviewed strategies to maximize success, including removing cigarettes and smoking materials from environment.   -He is also encouraged to consider weaning off of alcohol.   - he is  advised to maintain close follow up with System, Provider Not In for primary care needs, as well as his other providers for optimal and coordinated care.   I spent 44 minutes in the care of the patient today including review of labs from [place], Lipids, Thyroid Function, Hematology (current and previous including abstractions from other facilities); face-to-face time discussing  his blood glucose readings/logs, discussing hypoglycemia and hyperglycemia episodes and symptoms, medications doses, his options of short and long term treatment based on the latest standards of care / guidelines;  discussion about incorporating lifestyle medicine;  and documenting the encounter.  Please refer to Patient Instructions for Blood Glucose Monitoring and Insulin/Medications Dosing Guide"  in media tab for additional information. Please  also refer to " Patient Self Inventory" in the Media  tab for reviewed elements of pertinent patient history.  Rayburn Felt participated in the discussions, expressed understanding, and voiced agreement with the  above plans.  All questions were answered to his satisfaction. he is encouraged to contact clinic should he have any questions or concerns prior to his return visit.    Follow up plan: - Return in about 4 months (around 05/10/2022) for Bring Meter and Logs- A1c in Office.  Glade Lloyd, MD [facility] [address] [place], [place] [address] Phone: [phone]  Fax: [phone]    01/10/2022, 5:08 PM  This note was partially dictated with voice recognition software. Similar sounding words can be transcribed inadequately or may not  be corrected upon review.

## 2022-01-10 NOTE — Patient Instructions (Signed)

## 2022-03-18 ENCOUNTER — Other Ambulatory Visit: Payer: Self-pay | Admitting: "Endocrinology

## 2022-05-15 ENCOUNTER — Ambulatory Visit: Payer: 59 | Admitting: "Endocrinology

## 2022-05-21 LAB — BASIC METABOLIC PANEL
BUN: 17 (ref 4–21)
CO2: 24 — AB (ref 13–22)
Chloride: 101 (ref 99–108)
Creatinine: 1 (ref 0.6–1.3)
Glucose: 175
Potassium: 4.1 mEq/L (ref 3.5–5.1)
Sodium: 139 (ref 137–147)

## 2022-05-21 LAB — COMPREHENSIVE METABOLIC PANEL
Albumin: 4.1 (ref 3.5–5.0)
Calcium: 9 (ref 8.7–10.7)
Globulin: 2.6

## 2022-05-21 LAB — HEPATIC FUNCTION PANEL
ALT: 15 U/L (ref 10–40)
AST: 15 (ref 14–40)
Alkaline Phosphatase: 83 (ref 25–125)
Bilirubin, Total: 0.4

## 2022-05-21 LAB — HEMOGLOBIN A1C: Hemoglobin A1C: 5.7

## 2022-05-21 LAB — TSH: TSH: 4.12 (ref 0.41–5.90)

## 2022-06-11 ENCOUNTER — Telehealth: Payer: Self-pay | Admitting: "Endocrinology

## 2022-06-11 NOTE — Telephone Encounter (Signed)
Pt's girlfriend called requesting a refill on his Humalog. Last office visit was 12/2021, no showed last appointment. Offered patient an appointment and she said she would get him to call and schedule the appt. Please advise. No appointment has been made

## 2022-06-12 NOTE — Telephone Encounter (Signed)
Left a message requesting pt's girlfriend return call to the office.

## 2022-06-13 ENCOUNTER — Other Ambulatory Visit: Payer: Self-pay | Admitting: "Endocrinology

## 2022-06-13 MED ORDER — INSULIN LISPRO PROT & LISPRO (75-25 MIX) 100 UNIT/ML KWIKPEN: PEN_INJECTOR | SUBCUTANEOUS | 0 refills | Status: DC

## 2022-06-13 NOTE — Telephone Encounter (Signed)
Noted  

## 2022-06-13 NOTE — Telephone Encounter (Signed)
Bambi returning your call

## 2022-06-13 NOTE — Telephone Encounter (Signed)
Discussed with pt's girlfriend, she stated pt ran out of his insulin last night and the metformin does not control his BG. Pt has scheduled the first available appointment on July 25th. Asked if you would refill enough of his humalog 75/25 to get him to his appointment.

## 2022-07-09 ENCOUNTER — Ambulatory Visit (INDEPENDENT_AMBULATORY_CARE_PROVIDER_SITE_OTHER): Payer: 59 | Admitting: "Endocrinology

## 2022-07-09 ENCOUNTER — Encounter: Payer: Self-pay | Admitting: "Endocrinology

## 2022-07-09 VITALS — BP 138/84 | HR 84 | Ht 70.0 in | Wt 220.8 lb

## 2022-07-09 DIAGNOSIS — E1165 Type 2 diabetes mellitus with hyperglycemia: Secondary | ICD-10-CM

## 2022-07-09 DIAGNOSIS — F17200 Nicotine dependence, unspecified, uncomplicated: Secondary | ICD-10-CM | POA: Diagnosis not present

## 2022-07-09 DIAGNOSIS — E559 Vitamin D deficiency, unspecified: Secondary | ICD-10-CM | POA: Diagnosis not present

## 2022-07-09 MED ORDER — INSULIN LISPRO PROT & LISPRO (75-25 MIX) 100 UNIT/ML KWIKPEN: PEN_INJECTOR | SUBCUTANEOUS | 0 refills | Status: DC

## 2022-07-09 MED ORDER — FREESTYLE LIBRE 2 SENSOR MISC: 1.0000 | 3 refills | Status: AC

## 2022-07-09 MED ORDER — FREESTYLE LIBRE 2 READER DEVI: 0 refills | Status: AC

## 2022-07-09 NOTE — Patient Instructions (Signed)

## 2022-07-09 NOTE — Progress Notes (Signed)
07/09/2022, 8:48 PM   Endocrinology follow-up note  Subjective:    Patient ID: Howard Cameron, male    DOB: 62-Mar-1962.  Howard Cameron is being seen in follow-up after he was seen in consultation for management of currently uncontrolled symptomatic diabetes requested by  System, Provider Not In.   Past Medical History:  Diagnosis Date   Diabetes mellitus without complication ([place])    Diabetes mellitus, type II ([place])    Pneumonia     Past Surgical History:  Procedure Laterality Date   BACK SURGERY      Social History   Socioeconomic History   Marital status: Single    Spouse name: Not on file   Number of children: Not on file   Years of education: Not on file   Highest education level: Not on file  Occupational History   Not on file  Tobacco Use   Smoking status: Every Day    Packs/day: 1.50    Types: Cigarettes   Smokeless tobacco: Never  Vaping Use   Vaping Use: Never used  Substance and Sexual Activity   Alcohol use: Yes    Comment: couple of drinks a night   Drug use: Not Currently   Sexual activity: Not on file  Other Topics Concern   Not on file  Social History Narrative   Not on file   Social Determinants of Health   Financial Resource Strain: Not on file  Food Insecurity: Not on file  Transportation Needs: Not on file  Physical Activity: Not on file  Stress: Not on file  Social Connections: Not on file    History reviewed. No pertinent family history.  Outpatient Encounter Medications as of 07/09/2022  Medication Sig   Continuous Blood Gluc Receiver (FREESTYLE LIBRE 2 READER) DEVI As directed   Continuous Blood Gluc Sensor (FREESTYLE LIBRE 2 SENSOR) MISC 1 Piece by Does not apply route every 14 (fourteen) days.   BD PEN NEEDLE NANO 2ND GEN 32G X 4 MM MISC USE TWICE DAILY AS NEEDED BEFORE A MEAL   blood glucose meter kit and supplies Dispense based on patient and  insurance preference. Use up to four times daily as directed. (FOR ICD-10 E10.9, E11.9).   Cholecalciferol (VITAMIN D-3) 125 MCG (5000 UT) TABS Take 1 tablet by mouth daily in the afternoon.   CINNAMON PO Take 1 tablet by mouth daily in the afternoon.   Insulin Lispro Prot & Lispro (HUMALOG 75/25 MIX) (75-25) 100 UNIT/ML Kwikpen Inject 10 Units into the skin 2 (two) times daily before a meal.   MELATONIN GUMMIES PO Take 2 tablets by mouth at bedtime.   metformin (FORTAMET) 500 MG (OSM) 24 hr tablet Take 1 tablet (500 mg total) by mouth daily with breakfast.   Omega-3 Fatty Acids (FISH OIL OMEGA-3 PO) Take 1 capsule by mouth daily in the afternoon.   ondansetron (ZOFRAN) 4 MG tablet Take 1 tablet (4 mg total) by mouth every 8 (eight) hours as needed for nausea or vomiting.   sildenafil (VIAGRA) 100 MG tablet Take 50-100 mg by mouth daily as needed.   traZODone (DESYREL) 50 MG tablet Take 50 mg by mouth at bedtime.   [  DISCONTINUED] Insulin Lispro Prot & Lispro (HUMALOG 75/25 MIX) (75-25) 100 UNIT/ML Kwikpen Inject 10 Units into the skin 2 (two) times daily before a meal. (Patient taking differently: Inject 20 Units into the skin each morning at breakfast and 15 units at supper)   [DISCONTINUED] Vitamin D, Ergocalciferol, (DRISDOL) 1.25 MG (50000 UNIT) CAPS capsule Take 1 capsule (50,000 Units total) by mouth every 7 (seven) days. (Patient not taking: Reported on 01/10/2022)   No facility-administered encounter medications on file as of 07/09/2022.    ALLERGIES: No Known Allergies  VACCINATION STATUS: Immunization History  Administered Date(s) Administered   Influenza,inj,Quad PF,6+ Mos 09/15/2021    Diabetes He presents for his follow-up diabetic visit. He has type 2 diabetes mellitus. Onset time: He was told to have prediabetes since at least 2014, was not diagnosed with diabetes until his recent hospitalization a month ago. His disease course has been stable. There are no hypoglycemic  associated symptoms. Pertinent negatives for hypoglycemia include no confusion, headaches, pallor or seizures. Pertinent negatives for diabetes include no chest pain, no fatigue, no polydipsia, no polyphagia, no polyuria and no weakness. There are no hypoglycemic complications. Symptoms are stable. Diabetic complications include peripheral neuropathy and PVD. Risk factors for coronary artery disease include male sex, sedentary lifestyle and tobacco exposure. Current diabetic treatments: He takes 2000 mg of metformin ER daily. His weight is increasing steadily (Historically, patient weighed more than 200 pounds.  He gives history of heavy alcohol use, still drinks.  He did have unintended weight loss over the years, gained 5 pounds since last visit, a good development for him). He is following a generally unhealthy diet. When asked about meal planning, he reported none. He has not had a previous visit with a dietitian. He rarely participates in exercise. His home blood glucose trend is decreasing steadily. His breakfast blood glucose range is generally 140-180 mg/dl. His bedtime blood glucose range is generally 140-180 mg/dl. His overall blood glucose range is 140-180 mg/dl. (Mr. Garbers presents with continued improvement in his glycemic profile, previsit labs show A1c of 5.7% associated with normal renal function.  This is an overall improvement in his A1c from 10.3%.   He did not document any hypoglycemia.   In a positive development, he has regained his weight that he lost when he was in the catabolic phase due to hyperglycemia.     ) An ACE inhibitor/angiotensin II receptor blocker is not being taken.     Review of Systems  Constitutional:  Negative for chills, fatigue, fever and unexpected weight change.  HENT:  Negative for dental problem, mouth sores and trouble swallowing.   Eyes:  Negative for visual disturbance.  Respiratory:  Negative for cough, choking, chest tightness, shortness of breath and  wheezing.   Cardiovascular:  Negative for chest pain, palpitations and leg swelling.  Gastrointestinal:  Negative for abdominal distention, abdominal pain, constipation, diarrhea, nausea and vomiting.  Endocrine: Negative for polydipsia, polyphagia and polyuria.  Genitourinary:  Negative for dysuria, flank pain, hematuria and urgency.  Musculoskeletal:  Negative for back pain, gait problem, myalgias and neck pain.  Skin:  Negative for pallor, rash and wound.  Neurological:  Negative for seizures, syncope, weakness, numbness and headaches.  Psychiatric/Behavioral:  Negative for confusion and dysphoric mood.     Objective:       07/09/2022    1:28 PM 01/10/2022    3:45 PM 11/05/2021   10:45 AM  Vitals with BMI  Height 5' 10"  5' 10"  5' 10"   Weight 220 lbs 13 oz 198 lbs 10 oz 180 lbs  BMI 31.68 34.2 87.68  Systolic 115 726 203  Diastolic 84 80 77  Pulse 84 96 109    BP 138/84   Pulse 84   Ht 5' 10"  (1.778 m)   Wt 220 lb 12.8 oz (100.2 kg)   BMI 31.68 kg/m   Wt Readings from Last 3 Encounters:  07/09/22 220 lb 12.8 oz (100.2 kg)  01/10/22 198 lb 9.6 oz (90.1 kg)  11/05/21 180 lb (81.6 kg)      CMP ( most recent) CMP     Component Value Date/Time   NA 139 05/21/2022 0000   K 4.1 05/21/2022 0000   CL 101 05/21/2022 0000   CO2 24 (A) 05/21/2022 0000   GLUCOSE 237 (H) 10/22/2021 0004   BUN 17 05/21/2022 0000   CREATININE 1.0 05/21/2022 0000   CREATININE 0.74 10/22/2021 0004   CALCIUM 9.0 05/21/2022 0000   PROT 5.3 (L) 09/16/2021 0457   ALBUMIN 4.1 05/21/2022 0000   AST 15 05/21/2022 0000   ALT 15 05/21/2022 0000   ALKPHOS 83 05/21/2022 0000   BILITOT 0.7 09/16/2021 0457   GFRNONAA >60 10/22/2021 0004     Diabetic Labs (most recent): Lab Results  Component Value Date   HGBA1C 5.7 05/21/2022   HGBA1C 10.3 (H) 09/14/2021     Lab Results  Component Value Date   TSH 4.12 05/21/2022   TSH 1.550 09/15/2021      Assessment & Plan:   1. Poorly controlled  type 2 diabetes mellitus ([place]) - MALAKAI SCHOENHERR has currently uncontrolled symptomatic type 2 DM since  61 years of age (history of prediabetes for 8 years prior to diagnosis with type 2 diabetes) with most recent A1c of 10.3 %.   Mr. Bogusz presents with continued improvement in his glycemic profile, previsit labs show A1c of 5.7% associated with normal renal function.  This is an overall improvement in his A1c from 10.3%.   He did not document any hypoglycemia.   In a positive development, he has regained his weight that he lost when he was in the catabolic phase due to hyperglycemia.    -I reviewed his recent labs with him . - I had a long discussion with him about the progressive nature of diabetes and the pathology behind its complications. -his diabetes is complicated by neuropathy, significant alcohol history, continued heavy smoking and he remains at a high risk for more acute and chronic complications which include CAD, CVA, CKD, retinopathy, and neuropathy. These are all discussed in detail with him.  - I have counseled him on diet  and weight management  by adopting a carbohydrate restricted/protein rich diet. Patient is encouraged to switch to  unprocessed or minimally processed     complex starch and increased protein intake (animal or plant source), fruits, and vegetables. -  he is advised to stick to a routine mealtimes to eat 3 meals  a day and avoid unnecessary snacks ( to snack only to correct hypoglycemia).     - he acknowledges that there is a room for improvement in his food and drink choices. - Suggestion is made for him to avoid simple carbohydrates  from his diet including Cakes, Sweet Desserts, Ice Cream, Soda (diet and regular), Sweet Tea, Candies, Chips, Cookies, Store Bought Juices, Alcohol , Artificial Sweeteners,  Coffee Creamer, and "Sugar-free" Products, Lemonade. This will help patient to have more stable blood glucose profile and potentially avoid  unintended weight  gain.  The following Lifestyle Medicine recommendations according to [place]  [facility]) were discussed and and offered to patient and he  agrees to start the journey:  A. Whole Foods, Plant-Based Nutrition comprising of fruits and vegetables, plant-based proteins, whole-grain carbohydrates was discussed in detail with the patient.   A list for source of those nutrients were also provided to the patient.  Patient will use only water or unsweetened tea for hydration. B.  The need to stay away from risky substances including alcohol, smoking; obtaining 7 to 9 hours of restorative sleep, at least 150 minutes of moderate intensity exercise weekly, the importance of healthy social connections,  and stress management techniques were discussed. - I have approached him with the following individualized plan to manage  his diabetes and patient agrees:   -Risk of pancreatic diabetes in this patient is high. -In light of his presentation with tightening glycemic profile and A1c of 5.7%, he is advised to continue Humalog 75/25 10 units twice daily when Premeal blood glucose readings are above 90 mg per DL.    This patient will be considered for de-escalation of insulin treatment if he returns with A1c below 6% next visit.   He is advised to continue  monitoring of blood glucose 4 times a day-daily before meals and at bedtime.  He is advised not to inject insulin unless his Premeal blood glucose readings above 90 mg per DL. He will greatly benefit from a CGM device. I discussed and prescribed the freestyle libre device for him.  - he is encouraged to call clinic for blood glucose levels less than 70 or above 200 mg /dl. - he is advised to continue metformin 500 mg p.o. daily after breakfast.     -He is not a suitable candidate for incretin therapy, nor a full dose of metformin.  - Specific targets for  A1c;  LDL, HDL,  and Triglycerides were discussed with the patient.  2) Blood  Pressure /Hypertension: His blood pressure is controlled to target..  he is not on any antihypertensive medications.  Marland Kitchen    3) Lipids/Hyperlipidemia: He does not have recent lipid panel to review.  He is advised to continue his omega-3 fatty acids 1 capsule twice daily, he will be considered for fasting lipid panel on subsequent visits.   He is status posttreatment with high-dose vitamin D weekly.     4)  Weight/Diet:  Body mass index is 31.68 kg/m.  -    he is not a candidate for weight loss.  Exercise, and detailed carbohydrates information provided  -  detailed on discharge instructions.  5) Chronic Care/Health Maintenance:  -he  is not  on ACEI/ARB and Statin medications and  is encouraged to initiate and continue to follow up with Ophthalmology, Dentist,  Podiatrist at least yearly or according to recommendations, and advised to  quit smoking. I have recommended yearly flu vaccine and pneumonia vaccine at least every 5 years; moderate intensity exercise for up to 150 minutes weekly; and  sleep for at least 7 hours a day.  The patient was counseled on the dangers of tobacco use, and was advised to quit.  Reviewed strategies to maximize success, including removing cigarettes and smoking materials from environment.   -He is also encouraged to consider weaning off of alcohol. The patient was counseled on the dangers of tobacco use, and was advised to quit.  Reviewed strategies to maximize success, including removing cigarettes and smoking  materials from environment.   - he is  advised to maintain close follow up with System, Provider Not In for primary care needs, as well as his other providers for optimal and coordinated care.    I spent 32 minutes in the care of the patient today including review of labs from [facility], Lipids, Thyroid Function, Hematology (current and previous including abstractions from other facilities); face-to-face time discussing  his blood glucose readings/logs,  discussing hypoglycemia and hyperglycemia episodes and symptoms, medications doses, his options of short and long term treatment based on the latest standards of care / guidelines;  discussion about incorporating lifestyle medicine;  and documenting the encounter. Risk reduction counseling performed per USPSTF guidelines to reduce obesity and cardiovascular risk factors.     Please refer to Patient Instructions for Blood Glucose Monitoring and Insulin/Medications Dosing Guide"  in media tab for additional information. Please  also refer to " Patient Self Inventory" in the Media  tab for reviewed elements of pertinent patient history.  Rayburn Felt participated in the discussions, expressed understanding, and voiced agreement with the above plans.  All questions were answered to his satisfaction. he is encouraged to contact clinic should he have any questions or concerns prior to his return visit.     Follow up plan: - Return in about 4 months (around 11/09/2022) for Bring Meter/CGM Device/Logs- A1c in Office.  Glade Lloyd, MD [facility] [address] [place], [place] [address] Phone: [phone]  Fax: [phone]    07/09/2022, 8:48 PM  This note was partially dictated with voice recognition software. Similar sounding words can be transcribed inadequately or may not  be corrected upon review.

## 2022-07-30 ENCOUNTER — Other Ambulatory Visit: Payer: Self-pay | Admitting: "Endocrinology

## 2022-11-05 ENCOUNTER — Telehealth: Payer: Self-pay

## 2022-11-05 DIAGNOSIS — E1165 Type 2 diabetes mellitus with hyperglycemia: Secondary | ICD-10-CM

## 2022-11-05 MED ORDER — BLOOD GLUCOSE METER KIT: PACK | 0 refills | Status: AC

## 2022-11-05 MED ORDER — GLUCOSE BLOOD VI STRP: 1.0000 | ORAL_STRIP | Freq: Four times a day (QID) | 2 refills | Status: AC

## 2022-11-05 MED ORDER — LANCETS 30G MISC: 2 refills | Status: AC

## 2022-11-05 NOTE — Telephone Encounter (Signed)
Pt states he no longer wants to use his Freestyle Libre CGM, requested Rx for a standard BG monitor, test strips and lancets. Rx's for each sent in to pharmacy.

## 2022-11-20 ENCOUNTER — Ambulatory Visit: Payer: 59 | Admitting: "Endocrinology

## 2022-11-27 ENCOUNTER — Other Ambulatory Visit (HOSPITAL_COMMUNITY): Payer: Self-pay

## 2022-12-18 ENCOUNTER — Other Ambulatory Visit: Payer: Self-pay | Admitting: "Endocrinology

## 2022-12-19 ENCOUNTER — Other Ambulatory Visit: Payer: Self-pay

## 2022-12-19 MED ORDER — INSULIN LISPRO PROT & LISPRO (75-25 MIX) 100 UNIT/ML KWIKPEN: 10.0000 [IU] | PEN_INJECTOR | Freq: Two times a day (BID) | SUBCUTANEOUS | 0 refills | Status: AC

## 2022-12-24 ENCOUNTER — Ambulatory Visit: Payer: 59 | Admitting: "Endocrinology

## 2023-01-15 ENCOUNTER — Other Ambulatory Visit: Payer: Self-pay | Admitting: "Endocrinology

## 2023-01-16 ENCOUNTER — Ambulatory Visit: Payer: 59 | Admitting: "Endocrinology

## 2023-02-24 ENCOUNTER — Other Ambulatory Visit: Payer: Self-pay | Admitting: "Endocrinology

## 2023-08-17 IMAGING — DX DG TOE 4TH 2+V*L*
3 series · 3 of 3 positions shown · non-contrast
Comparison: None.

CLINICAL DATA: Trauma to the left fourth toe.

EXAM:
LEFT FOURTH TOE

[toe ap]
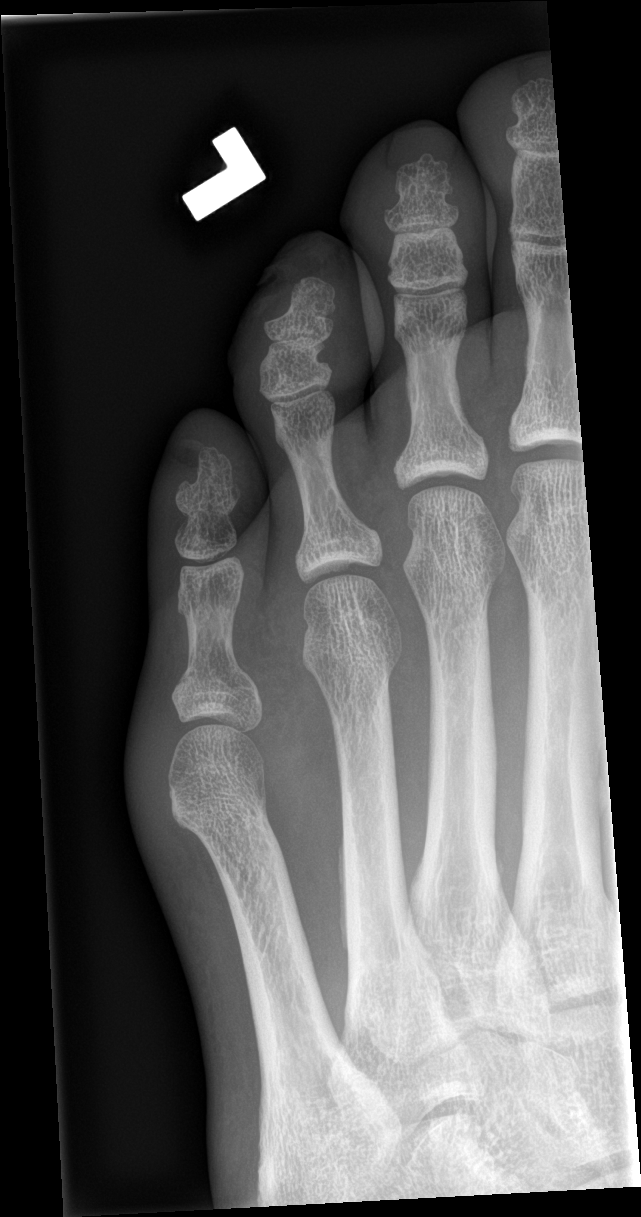

[toe obl]
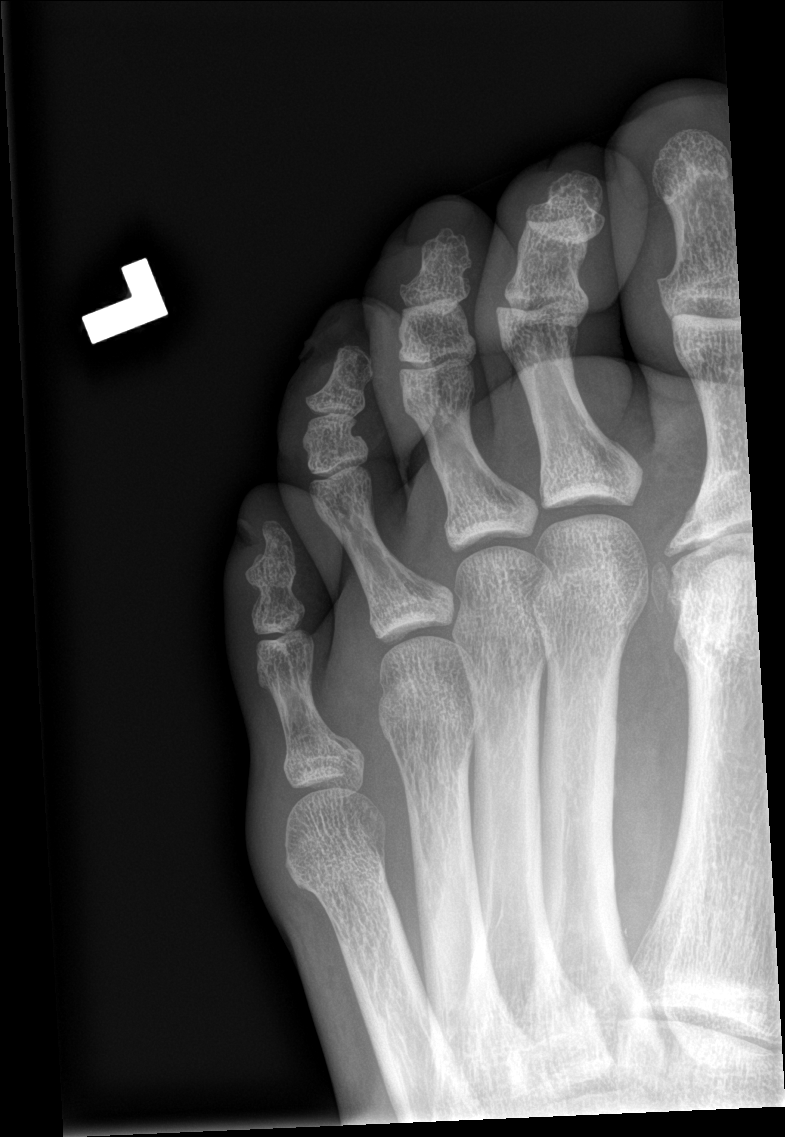

[toe lat]
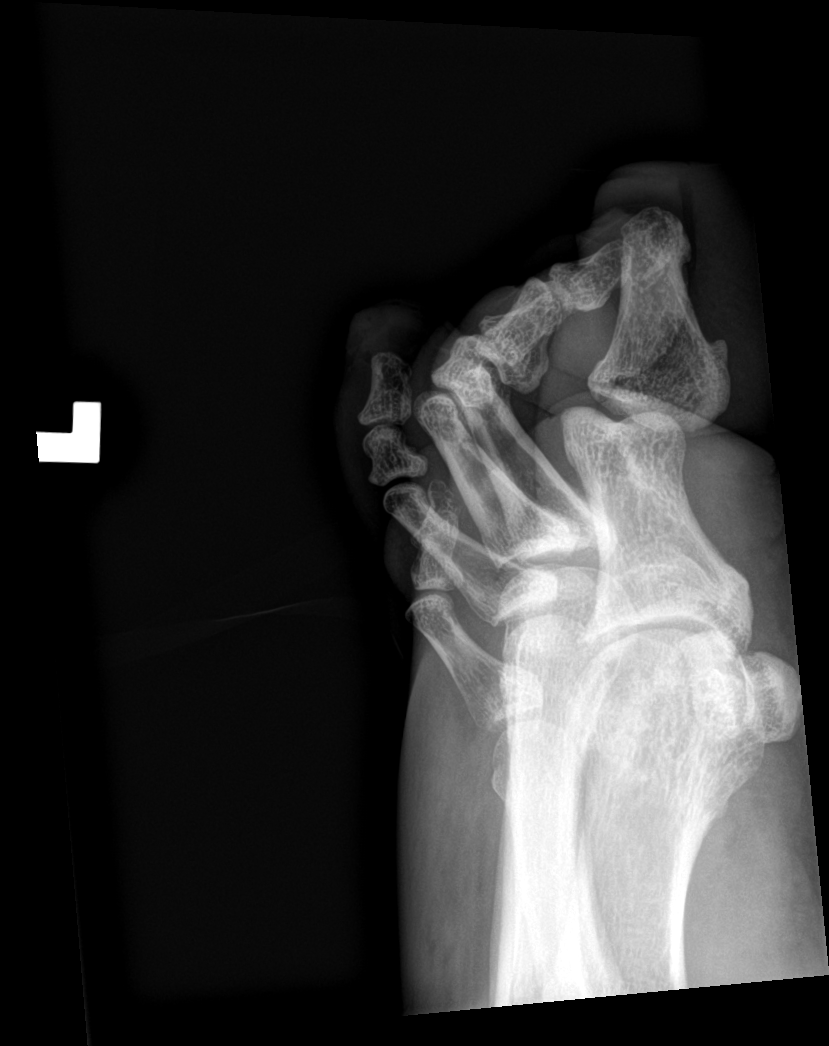

[3 of 3 positions shown; findings below may reference images not displayed]

FINDINGS: There is no acute fracture or dislocation. There is no aspiration of
the soft tissue of the fourth toe. No radiopaque foreign object or
soft tissue gas.
IMPRESSION: No acute fracture or dislocation.

## 2024-06-29 ENCOUNTER — Emergency Department (HOSPITAL_COMMUNITY): Admission: EM | Admit: 2024-06-29 | Discharge: 2024-06-29 | Disposition: A | Discharge status: Home / Self Care

## 2024-06-29 ENCOUNTER — Other Ambulatory Visit: Payer: Self-pay

## 2024-06-29 ENCOUNTER — Encounter (HOSPITAL_COMMUNITY): Payer: Self-pay

## 2024-06-29 ENCOUNTER — Emergency Department (HOSPITAL_COMMUNITY)

## 2024-06-29 DIAGNOSIS — R0789 Other chest pain: Secondary | ICD-10-CM | POA: Diagnosis present

## 2024-06-29 DIAGNOSIS — Z7984 Long term (current) use of oral hypoglycemic drugs: Secondary | ICD-10-CM | POA: Diagnosis not present

## 2024-06-29 DIAGNOSIS — R000 Tachycardia, unspecified: Secondary | ICD-10-CM | POA: Insufficient documentation

## 2024-06-29 DIAGNOSIS — Z79899 Other long term (current) drug therapy: Secondary | ICD-10-CM | POA: Diagnosis not present

## 2024-06-29 DIAGNOSIS — F17210 Nicotine dependence, cigarettes, uncomplicated: Secondary | ICD-10-CM | POA: Diagnosis not present

## 2024-06-29 DIAGNOSIS — R051 Acute cough: Secondary | ICD-10-CM | POA: Diagnosis not present

## 2024-06-29 DIAGNOSIS — Z794 Long term (current) use of insulin: Secondary | ICD-10-CM | POA: Insufficient documentation

## 2024-06-29 DIAGNOSIS — Z7982 Long term (current) use of aspirin: Secondary | ICD-10-CM | POA: Diagnosis not present

## 2024-06-29 DIAGNOSIS — R0981 Nasal congestion: Secondary | ICD-10-CM | POA: Insufficient documentation

## 2024-06-29 DIAGNOSIS — R0602 Shortness of breath: Secondary | ICD-10-CM | POA: Insufficient documentation

## 2024-06-29 LAB — COMPREHENSIVE METABOLIC PANEL WITH GFR
ALT: 18 U/L (ref 0–44)
AST: 17 U/L (ref 15–41)
Albumin: 3.7 g/dL (ref 3.5–5.0)
Alkaline Phosphatase: 87 U/L (ref 38–126)
Anion gap: 10 (ref 5–15)
BUN: 16 mg/dL (ref 8–23)
CO2: 30 mmol/L (ref 22–32)
Calcium: 9.6 mg/dL (ref 8.9–10.3)
Chloride: 98 mmol/L (ref 98–111)
Creatinine, Ser: 1.07 mg/dL (ref 0.61–1.24)
GFR, Estimated: >60 mL/min (ref >60)
Glucose, Bld: 195 mg/dL — ABNORMAL HIGH (ref 70–99)
Potassium: 4.7 mmol/L (ref 3.5–5.1)
Sodium: 138 mmol/L (ref 135–145)
Total Bilirubin: 0.9 mg/dL (ref 0.0–1.2)
Total Protein: 7 g/dL (ref 6.5–8.1)

## 2024-06-29 LAB — CBC WITH DIFFERENTIAL/PLATELET
Abs Immature Granulocytes: 0.02 K/uL (ref 0.00–0.07)
Basophils Absolute: 0.1 K/uL (ref 0.0–0.1)
Basophils Relative: 1 %
Eosinophils Absolute: 1.6 K/uL — ABNORMAL HIGH (ref 0.0–0.5)
Eosinophils Relative: 15 %
HCT: 41.8 % (ref 39.0–52.0)
Hemoglobin: 15 g/dL (ref 13.0–17.0)
Immature Granulocytes: 0 %
Lymphocytes Relative: 22 %
Lymphs Abs: 2.2 K/uL (ref 0.7–4.0)
MCH: 35.2 pg — ABNORMAL HIGH (ref 26.0–34.0)
MCHC: 35.9 g/dL (ref 30.0–36.0)
MCV: 98.1 fL (ref 80.0–100.0)
Monocytes Absolute: 0.9 K/uL (ref 0.1–1.0)
Monocytes Relative: 9 %
Neutro Abs: 5.4 K/uL (ref 1.7–7.7)
Neutrophils Relative %: 53 %
Platelets: 215 K/uL (ref 150–400)
RBC: 4.26 MIL/uL (ref 4.22–5.81)
RDW: 12.1 % (ref 11.5–15.5)
WBC: 10.3 K/uL (ref 4.0–10.5)
nRBC: 0 % (ref 0.0–0.2)

## 2024-06-29 LAB — RESP PANEL BY RT-PCR (RSV, FLU A&B, COVID)  RVPGX2
Influenza A by PCR: NEGATIVE
Influenza B by PCR: NEGATIVE
Resp Syncytial Virus by PCR: NEGATIVE
SARS Coronavirus 2 by RT PCR: NEGATIVE

## 2024-06-29 LAB — BRAIN NATRIURETIC PEPTIDE: B Natriuretic Peptide: 126 pg/mL — ABNORMAL HIGH (ref 0.0–100.0)

## 2024-06-29 LAB — TROPONIN I (HIGH SENSITIVITY)
Troponin I (High Sensitivity): 14 ng/L (ref <18)
Troponin I (High Sensitivity): 13 ng/L (ref <18)

## 2024-06-29 LAB — CBG MONITORING, ED: Glucose-Capillary: 167 mg/dL — ABNORMAL HIGH (ref 70–99)

## 2024-06-29 LAB — D-DIMER, QUANTITATIVE: D-Dimer, Quant: 0.33 ug{FEU}/mL (ref 0.00–0.50)

## 2024-06-29 MED ORDER — AZITHROMYCIN 250 MG PO TABS: 250.0000 mg | ORAL_TABLET | Freq: Every day | ORAL | 0 refills | Status: AC

## 2024-06-29 MED ORDER — ALBUTEROL SULFATE HFA 108 (90 BASE) MCG/ACT IN AERS: 1.0000 | INHALATION_SPRAY | Freq: Four times a day (QID) | RESPIRATORY_TRACT | 0 refills | Status: AC | PRN

## 2024-06-29 MED ORDER — PREDNISONE 10 MG PO TABS: 20.0000 mg | ORAL_TABLET | Freq: Every day | ORAL | 0 refills | Status: AC

## 2024-06-29 NOTE — ED Triage Notes (Signed)
 Pt arrived via POV c/o chest pain since this past Friday. Pt endorses feeling SOB. Pt reports pain in the left chest that is tight and non-radiating.

## 2024-06-29 NOTE — Discharge Instructions (Signed)
 Having any worsening chest pain or shortness of breath please come back to the ED.   Write down your blood pressure twice per day.  Take this to your primary care physician or cardiologist so that way we can address your blood pressure.   I do wonder if you could be developing COPD given your smoking history as well as a lung sounds today.  Please follow-up with pulmonology.  Want to follow-up with cardiology given your risk factors as well.

## 2024-06-29 NOTE — ED Provider Notes (Signed)
 [place] EMERGENCY DEPARTMENT AT [facility] Provider Note   CSN: [id number] Arrival date & time: 06/29/24  1756     Patient presents with: Chest Pain   Howard Cameron is a 63 y.o. male.    Chest Pain       Patient endorses shortness of breath.  Patient has been short of breath for about the past week.  Endorsing nasal congestion.  Feels like is having a hard time breathing especially with exertion.  Denies any chest pain.  No exertional chest pain.  No hemoptysis.  No history of DVT or PE.  No obvious orthopnea that he endorses.  Patient does state he smokes cigarettes daily.  Is been smoking since about age 39.  No history of COPD diagnosis in the past.  No sick contacts he is aware of.  No recent travel history.  No pleuritic chest pain or hemoptysis.  No history of DVT or PE.  Denies any cardiac history.  Previous medical history reviewed : Patient was last admitted in 2022.  Sepsis due to pneumonia.   Prior to Admission medications   Medication Sig Start Date End Date Taking? Authorizing Provider  albuterol  (VENTOLIN  HFA) 108 (90 Base) MCG/ACT inhaler Inhale 1-2 puffs into the lungs every 6 (six) hours as needed for wheezing or shortness of breath. 06/29/24  Yes Simon Lavonia SAILOR, MD  aspirin EC 81 MG tablet Take 81 mg by mouth every other day. Swallow whole.   Yes [provider]  azithromycin  (ZITHROMAX ) 250 MG tablet Take 1 tablet (250 mg total) by mouth daily. Take first 2 tablets together, then 1 every day until finished. 06/29/24  Yes Simon Lavonia SAILOR, MD  Cholecalciferol (VITAMIN D -3) 125 MCG (5000 UT) TABS Take 1 tablet by mouth daily in the afternoon.   Yes [provider]  CINNAMON PO Take 1 tablet by mouth daily in the afternoon.   Yes [provider]  diphenhydrAMINE  HCl, Sleep, (ZZZQUIL) 25 MG CAPS Take 2 capsules by mouth at bedtime as needed (sleep).   Yes [provider]  escitalopram (LEXAPRO) 10 MG tablet Take 10 mg by  mouth daily.   Yes [provider]  fluticasone (FLONASE) 50 MCG/ACT nasal spray Place 1 spray into both nostrils every 6 (six) hours as needed (runny nose, congestion).   Yes [provider]  hydrOXYzine (ATARAX) 50 MG tablet Take 50 mg by mouth at bedtime.   Yes [provider]  Insulin  Lispro Prot & Lispro (HUMALOG  75/25 MIX) (75-25) 100 UNIT/ML Kwikpen Inject 10 Units into the skin 2 (two) times daily with a meal. 12/19/22  Yes Nida, Gebreselassie W, MD  loratadine (CLARITIN) 10 MG tablet Take 10 mg by mouth daily.   Yes [provider]  MELATONIN GUMMIES PO Take 2 tablets by mouth at bedtime.   Yes [provider]  metformin  (FORTAMET ) 500 MG (OSM) 24 hr tablet Take 1 tablet (500 mg total) by mouth daily with breakfast. 10/22/21  Yes Nida, Gebreselassie W, MD  Omega-3 Fatty Acids (FISH OIL OMEGA-3 PO) Take 1 capsule by mouth daily in the afternoon.   Yes [provider]  predniSONE  (DELTASONE ) 10 MG tablet Take 2 tablets (20 mg total) by mouth daily for 5 days. 06/29/24 07/04/24 Yes Simon Lavonia SAILOR, MD  blood glucose meter kit and supplies Dispense based on patient and insurance preference. Use up to four times daily as directed. (FOR ICD-10 E10.9, E11.9). 11/05/22   Nida, Gebreselassie W, MD  Continuous Blood Gluc Receiver (FREESTYLE LIBRE 2 READER) DEVI As directed 07/09/22   Nida, Gebreselassie W, MD  Continuous Blood Gluc Sensor (FREESTYLE LIBRE 2 SENSOR) MISC 1 Piece by Does not apply route every 14 (fourteen) days. 07/09/22   Nida, Gebreselassie W, MD  glucose blood test strip 1 each by Other route 4 (four) times daily. Use to test blood glucose four times daily as directed 11/05/22   Nida, Gebreselassie W, MD  Insulin  Pen Needle (BD PEN NEEDLE NANO 2ND GEN) 32G X 4 MM MISC USE TWICE DAILY AS NEEDED BEFORE A MEAL 01/15/23   Nida, Gebreselassie W, MD  Lancets 30G MISC Use to test blood glucose four times daily as directed. 11/05/22   Nida,  Gebreselassie W, MD    Allergies: Patient has no known allergies.    Review of Systems  Cardiovascular:  Positive for chest pain.    Updated Vital Signs BP (!) 149/91   Pulse 86   Temp (!) 97.5 F (36.4 C) (Oral)   Resp 18   Ht 5' 10 (1.778 m)   Wt 100.2 kg   SpO2 95%   BMI 31.70 kg/m   Physical Exam  (all labs ordered are listed, but only abnormal results are displayed) Labs Reviewed  CBC WITH DIFFERENTIAL/PLATELET - Abnormal; Notable for the following components:      Result Value   MCH 35.2 (*)    Eosinophils Absolute 1.6 (*)    All other components within normal limits  COMPREHENSIVE METABOLIC PANEL WITH GFR - Abnormal; Notable for the following components:   Glucose, Bld 195 (*)    All other components within normal limits  BRAIN NATRIURETIC PEPTIDE - Abnormal; Notable for the following components:   B Natriuretic Peptide 126.0 (*)    All other components within normal limits  CBG MONITORING, ED - Abnormal; Notable for the following components:   Glucose-Capillary 167 (*)    All other components within normal limits  RESP PANEL BY RT-PCR (RSV, FLU A&B, COVID)  [id number]  D-DIMER, QUANTITATIVE  TROPONIN I (HIGH SENSITIVITY)  TROPONIN I (HIGH SENSITIVITY)    EKG: EKG Interpretation Date/Time:  Tuesday June 29 2024 18:28:48 EDT Ventricular Rate:  101 PR Interval:  170 QRS Duration:  132 QT Interval:  376 QTC Calculation: 487 R Axis:   103  Text Interpretation: Sinus tachycardia Right bundle branch block Abnormal ECG When compared with ECG of 29-Jun-2024 18:25, No significant change was found Confirmed by Simon Rea [phone]) on 06/29/2024 7:29:37 PM  Radiology: [facility] Chest 2 View Result Date: 06/29/2024 CLINICAL DATA:  chest pain EXAM: CHEST - 2 VIEW COMPARISON:  October 08, 2021 FINDINGS: No focal airspace consolidation, pleural effusion, or pneumothorax. No cardiomegaly. No acute fracture or destructive lesion. Multilevel thoracic osteophytosis. IMPRESSION: No  acute cardiopulmonary abnormality. Electronically Signed   By: Rogelia Myers M.D.   On: 06/29/2024 18:55     Procedures   Medications Ordered in the ED - No data to display                                  Medical Decision Making Amount and/or Complexity of Data Reviewed Labs: ordered. Radiology: ordered.  Risk Prescription drug management.       Patient endorses shortness of breath.  Patient has been short of breath for about the past week.  Endorsing nasal congestion.  Feels like is having a hard time breathing especially with exertion.  Denies any chest pain.  No exertional chest pain.  No hemoptysis.  No history of DVT or PE.  No obvious orthopnea that he endorses.  Patient does state he smokes cigarettes daily.  Is been smoking since about age 85.  No history of COPD diagnosis in the past.  No sick contacts he is aware of.  No recent travel history.  No pleuritic chest pain or hemoptysis.  No history of DVT or PE.  Denies any cardiac history.  Previous medical history reviewed : Patient was last admitted in 2022.  Sepsis due to pneumonia.    Exam, patient no acute distress.  Hemodynamically stable.  Saturation high 90s room air.   Decreased breath sounds bilaterally.  Slight expiratory wheezing.   EKG reviewed.  No STEMI.  Troponin x 2 unremarkable.  No exertional chest pain.  Do not think it is cardiac related this point time.  Cardiac telemetry reviewed.  No arrhythmia.   No pleuritic chest pain.  No hemoptysis.  Never started with DVT or PE.  Heart rate was around 100 at 1 point in time so cannot apply the [facility] rule.  Therefore, did obtain D-dimer.  Unremarkable.  No further PE workup needed.   Chest x-ray reviewed.  No pneumothorax or infiltrate was seen.   Do wonder if he could be developing COPD.  Therefore, did start patient on prednisone  as well as azithromycin  outpatient.  He can follow-up with pulmonology outpatient.  He will also follow-up with  cardiology outpatient given risk factors.   Remained O2 saturation room air 98%.  No acute distress.   No concerns for other intrathoracic pathology.  No indication for CT imaging.       Final diagnoses:  Shortness of breath  Atypical chest pain  Acute cough    ED Discharge Orders          Ordered    azithromycin  (ZITHROMAX ) 250 MG tablet  Daily        06/29/24 2219    predniSONE  (DELTASONE ) 10 MG tablet  Daily        06/29/24 2219    albuterol  (VENTOLIN  HFA) 108 (90 Base) MCG/ACT inhaler  Every 6 hours PRN        06/29/24 2219               Simon Lavonia SAILOR, MD 06/29/24 2220

## 2024-06-29 NOTE — ED Notes (Signed)
 The patient is A&OX4, ambulatory with independent steady gait, NAD. Pt verbalized understanding of d/c instructions, prescriptions and follow up care.

## 2024-06-29 NOTE — ED Notes (Signed)
 Pt BP 184/99, Dr. Simon informed via epic secure chat.

## 2024-09-08 ENCOUNTER — Other Ambulatory Visit (HOSPITAL_COMMUNITY): Payer: Self-pay | Admitting: Adult Health

## 2024-09-08 ENCOUNTER — Ambulatory Visit (HOSPITAL_COMMUNITY)
Admission: RE | Admit: 2024-09-08 | Discharge: 2024-09-08 | Disposition: A | Discharge status: Home / Self Care | Source: Ambulatory Visit | Attending: Adult Health | Admitting: Adult Health

## 2024-09-08 DIAGNOSIS — M79671 Pain in right foot: Secondary | ICD-10-CM
